# Patient Record
Sex: Female | Born: 1960 | Race: White | Hispanic: No | Marital: Single | Smoking: Current every day smoker
Health system: Southern US, Community
[De-identification: ages and names within clinical notes are randomized; demographics above are authoritative.]

## PROBLEM LIST (undated history)

## (undated) DIAGNOSIS — K589 Irritable bowel syndrome without diarrhea: Secondary | ICD-10-CM

## (undated) DIAGNOSIS — F419 Anxiety disorder, unspecified: Secondary | ICD-10-CM

## (undated) DIAGNOSIS — Z7189 Other specified counseling: Secondary | ICD-10-CM

## (undated) DIAGNOSIS — B009 Herpesviral infection, unspecified: Secondary | ICD-10-CM

## (undated) DIAGNOSIS — N951 Menopausal and female climacteric states: Principal | ICD-10-CM

## (undated) DIAGNOSIS — M8700 Idiopathic aseptic necrosis of unspecified bone: Secondary | ICD-10-CM

## (undated) DIAGNOSIS — G709 Myoneural disorder, unspecified: Secondary | ICD-10-CM

## (undated) DIAGNOSIS — M797 Fibromyalgia: Secondary | ICD-10-CM

## (undated) DIAGNOSIS — F17200 Nicotine dependence, unspecified, uncomplicated: Secondary | ICD-10-CM

## (undated) HISTORY — DX: Fibromyalgia: M79.7

## (undated) HISTORY — PX: CARPAL TUNNEL RELEASE: SHX101

## (undated) HISTORY — DX: Irritable bowel syndrome without diarrhea: K58.9

## (undated) HISTORY — PX: KNEE SURGERY: SHX244

## (undated) HISTORY — DX: Nicotine dependence, unspecified, uncomplicated: F17.200

## (undated) HISTORY — PX: NECK SURGERY: SHX720

## (undated) HISTORY — DX: Herpesviral infection, unspecified: B00.9

## (undated) HISTORY — PX: ABDOMINAL HYSTERECTOMY: SHX81

## (undated) HISTORY — DX: Menopausal and female climacteric states: N95.1

## (undated) HISTORY — PX: TUBAL LIGATION: SHX77

## (undated) HISTORY — DX: Myoneural disorder, unspecified: G70.9

## (undated) HISTORY — DX: Anxiety disorder, unspecified: F41.9

## (undated) HISTORY — DX: Other specified counseling: Z71.89

## (undated) HISTORY — DX: Idiopathic aseptic necrosis of unspecified bone: M87.00

## (undated) HISTORY — PX: SPINAL FUSION: SHX223

---

## 2004-07-11 ENCOUNTER — Emergency Department (HOSPITAL_COMMUNITY): Admission: EM | Admit: 2004-07-11 | Discharge: 2004-07-11 | Payer: Self-pay | Admitting: Emergency Medicine

## 2005-01-19 ENCOUNTER — Ambulatory Visit (HOSPITAL_COMMUNITY): Admission: RE | Admit: 2005-01-19 | Discharge: 2005-01-19 | Payer: Self-pay | Admitting: Obstetrics and Gynecology

## 2006-02-07 ENCOUNTER — Ambulatory Visit (HOSPITAL_COMMUNITY): Admission: RE | Admit: 2006-02-07 | Discharge: 2006-02-07 | Payer: Self-pay | Admitting: Internal Medicine

## 2007-02-20 ENCOUNTER — Ambulatory Visit (HOSPITAL_COMMUNITY): Admission: RE | Admit: 2007-02-20 | Discharge: 2007-02-20 | Payer: Self-pay | Admitting: Internal Medicine

## 2009-11-03 ENCOUNTER — Emergency Department (HOSPITAL_COMMUNITY): Admission: EM | Admit: 2009-11-03 | Discharge: 2009-11-03 | Payer: Self-pay | Admitting: Emergency Medicine

## 2010-05-14 LAB — URINALYSIS, ROUTINE W REFLEX MICROSCOPIC
Bilirubin Urine: NEGATIVE
Glucose, UA: NEGATIVE mg/dL
Hgb urine dipstick: NEGATIVE
Ketones, ur: NEGATIVE mg/dL
Nitrite: NEGATIVE
Protein, ur: NEGATIVE mg/dL
Specific Gravity, Urine: 1.01 (ref 1.005–1.030)
Urobilinogen, UA: 0.2 mg/dL (ref 0.0–1.0)
pH: 6.5 (ref 5.0–8.0)

## 2010-05-14 LAB — CBC
HCT: 34.6 % — ABNORMAL LOW (ref 36.0–46.0)
Hemoglobin: 11.6 g/dL — ABNORMAL LOW (ref 12.0–15.0)
MCH: 32.1 pg (ref 26.0–34.0)
MCHC: 33.5 g/dL (ref 30.0–36.0)
MCV: 95.8 fL (ref 78.0–100.0)
Platelets: 275 10*3/uL (ref 150–400)
RBC: 3.61 MIL/uL — ABNORMAL LOW (ref 3.87–5.11)
RDW: 14.9 % (ref 11.5–15.5)
WBC: 6.9 10*3/uL (ref 4.0–10.5)

## 2010-05-14 LAB — DIFFERENTIAL
Basophils Absolute: 0.2 10*3/uL — ABNORMAL HIGH (ref 0.0–0.1)
Basophils Relative: 2 % — ABNORMAL HIGH (ref 0–1)
Eosinophils Absolute: 0.1 10*3/uL (ref 0.0–0.7)
Eosinophils Relative: 2 % (ref 0–5)
Lymphocytes Relative: 52 % — ABNORMAL HIGH (ref 12–46)
Lymphs Abs: 3.6 10*3/uL (ref 0.7–4.0)
Monocytes Absolute: 0.3 10*3/uL (ref 0.1–1.0)
Monocytes Relative: 4 % (ref 3–12)
Neutro Abs: 2.7 10*3/uL (ref 1.7–7.7)
Neutrophils Relative %: 40 % — ABNORMAL LOW (ref 43–77)

## 2010-05-14 LAB — COMPREHENSIVE METABOLIC PANEL
ALT: 14 U/L (ref 0–35)
AST: 13 U/L (ref 0–37)
Albumin: 4.2 g/dL (ref 3.5–5.2)
Alkaline Phosphatase: 108 U/L (ref 39–117)
BUN: 8 mg/dL (ref 6–23)
CO2: 29 mEq/L (ref 19–32)
Calcium: 9.4 mg/dL (ref 8.4–10.5)
Chloride: 105 mEq/L (ref 96–112)
Creatinine, Ser: 0.65 mg/dL (ref 0.4–1.2)
GFR calc Af Amer: >60 mL/min (ref >60)
GFR calc non Af Amer: >60 mL/min (ref >60)
Glucose, Bld: 98 mg/dL (ref 70–99)
Potassium: 4.1 mEq/L (ref 3.5–5.1)
Sodium: 140 mEq/L (ref 135–145)
Total Bilirubin: 0.5 mg/dL (ref 0.3–1.2)
Total Protein: 7.7 g/dL (ref 6.0–8.3)

## 2010-07-03 ENCOUNTER — Emergency Department (HOSPITAL_COMMUNITY)
Admission: EM | Admit: 2010-07-03 | Discharge: 2010-07-03 | Disposition: A | Discharge status: Home / Self Care | Payer: Medicare Other | Attending: Emergency Medicine | Admitting: Emergency Medicine

## 2010-07-03 DIAGNOSIS — H9209 Otalgia, unspecified ear: Secondary | ICD-10-CM | POA: Insufficient documentation

## 2010-07-03 DIAGNOSIS — IMO0002 Reserved for concepts with insufficient information to code with codable children: Secondary | ICD-10-CM | POA: Insufficient documentation

## 2010-09-02 ENCOUNTER — Emergency Department (HOSPITAL_COMMUNITY): Payer: Medicare Other

## 2010-09-02 ENCOUNTER — Emergency Department (HOSPITAL_COMMUNITY)
Admission: EM | Admit: 2010-09-02 | Discharge: 2010-09-02 | Disposition: A | Discharge status: Home / Self Care | Payer: Medicare Other | Attending: Emergency Medicine | Admitting: Emergency Medicine

## 2010-09-02 DIAGNOSIS — G8929 Other chronic pain: Secondary | ICD-10-CM | POA: Insufficient documentation

## 2010-09-02 DIAGNOSIS — S300XXA Contusion of lower back and pelvis, initial encounter: Secondary | ICD-10-CM | POA: Insufficient documentation

## 2010-09-02 DIAGNOSIS — M542 Cervicalgia: Secondary | ICD-10-CM | POA: Insufficient documentation

## 2010-09-02 DIAGNOSIS — K219 Gastro-esophageal reflux disease without esophagitis: Secondary | ICD-10-CM | POA: Insufficient documentation

## 2010-09-02 DIAGNOSIS — S93409A Sprain of unspecified ligament of unspecified ankle, initial encounter: Secondary | ICD-10-CM | POA: Insufficient documentation

## 2010-09-02 DIAGNOSIS — W108XXA Fall (on) (from) other stairs and steps, initial encounter: Secondary | ICD-10-CM | POA: Insufficient documentation

## 2010-09-02 DIAGNOSIS — Z79899 Other long term (current) drug therapy: Secondary | ICD-10-CM | POA: Insufficient documentation

## 2010-12-21 ENCOUNTER — Emergency Department (HOSPITAL_COMMUNITY)
Admission: EM | Admit: 2010-12-21 | Discharge: 2010-12-21 | Disposition: A | Discharge status: Home / Self Care | Payer: Medicare Other | Attending: Emergency Medicine | Admitting: Emergency Medicine

## 2010-12-21 ENCOUNTER — Encounter: Payer: Self-pay | Admitting: *Deleted

## 2010-12-21 DIAGNOSIS — F17200 Nicotine dependence, unspecified, uncomplicated: Secondary | ICD-10-CM | POA: Insufficient documentation

## 2010-12-21 DIAGNOSIS — N390 Urinary tract infection, site not specified: Secondary | ICD-10-CM

## 2010-12-21 LAB — URINE MICROSCOPIC-ADD ON

## 2010-12-21 LAB — URINALYSIS, ROUTINE W REFLEX MICROSCOPIC
Glucose, UA: 250 mg/dL — AB
Hgb urine dipstick: NEGATIVE
Nitrite: POSITIVE — AB
Protein, ur: 100 mg/dL — AB
Specific Gravity, Urine: 1.02 (ref 1.005–1.030)
Urobilinogen, UA: >8 mg/dL — ABNORMAL HIGH (ref 0.0–1.0)
pH: 5 (ref 5.0–8.0)

## 2010-12-21 LAB — GLUCOSE, CAPILLARY: Glucose-Capillary: 89 mg/dL (ref 70–99)

## 2010-12-21 MED ORDER — CEPHALEXIN 500 MG PO CAPS: 500.0000 mg | ORAL_CAPSULE | Freq: Four times a day (QID) | ORAL | Status: AC

## 2010-12-21 NOTE — ED Notes (Signed)
Pt states pain with urination, pressure to lower abdomen with urination and right lower back pain. NAD

## 2010-12-21 NOTE — ED Provider Notes (Signed)
History     CSN: [id number] Arrival date & time: 12/21/2010  5:06 PM   First MD Initiated Contact with Patient 12/21/10 1722    Chief Complaint  Patient presents with  . Dysuria    (Consider location/radiation/quality/duration/timing/severity/associated sxs/prior treatment) Patient is a 50 y.o. female presenting with dysuria. The history is provided by the patient.  Dysuria  This is a new problem. The current episode started 12 to 24 hours ago. The problem occurs every urination. The problem has been gradually worsening. The quality of the pain is described as burning. The pain is mild. There has been no fever. Associated symptoms include chills, urgency and flank pain. Pertinent negatives include no vomiting and no discharge. Treatments tried: AZO.    History reviewed. No pertinent past medical history.  Past Surgical History  Procedure Date  . Spinal fusion   . Neck surgery   . Carpal tunnel release   . Cesarean section   . Abdominal hysterectomy   . Knee surgery     No family history on file.  History  Substance Use Topics  . Smoking status: Current Everyday Smoker  . Smokeless tobacco: Not on file  . Alcohol Use: No    OB History    Grav Para Term Preterm Abortions TAB SAB Ect Mult Living                  Review of Systems  Constitutional: Positive for chills.  Gastrointestinal: Negative for vomiting.  Genitourinary: Positive for dysuria, urgency and flank pain.  Musculoskeletal: Positive for gait problem.    Allergies  Prednisone and Zomig  Home Medications   Current Outpatient Rx  Name Route Sig Dispense Refill  . ALPRAZOLAM 1 MG PO TABS Oral Take 1 mg by mouth 3 (three) times daily.      Marland Kitchen GABAPENTIN 800 MG PO TABS Oral Take 800 mg by mouth 4 (four) times daily.      Marland Kitchen KETOPROFEN 75 MG PO CAPS Oral Take 75 mg by mouth 4 (four) times daily as needed. migrains     . OMEPRAZOLE 40 MG PO CPDR Oral Take 40 mg by mouth daily.      Marland Kitchen PROMETHAZINE HCL  25 MG PO TABS Oral Take 25 mg by mouth every 6 (six) hours as needed. Nausea/vomiting     . TRAZODONE HCL 100 MG PO TABS Oral Take 100 mg by mouth at bedtime.      . CEPHALEXIN 500 MG PO CAPS Oral Take 1 capsule (500 mg total) by mouth 4 (four) times daily. 20 capsule 0    BP 112/69  Pulse 72  Temp(Src) 98.3 F (36.8 C) (Oral)  Resp 18  Ht 5\' 4"  (1.626 m)  Wt 118 lb (53.524 kg)  BMI 20.25 kg/m2  SpO2 98%  Physical Exam  CONSTITUTIONAL: Well developed/well nourished HEAD AND FACE: Normocephalic/atraumatic EYES: EOMI/PERRL ENMT: Mucous membranes moist NECK: supple no meningeal signs SPINE:entire spine nontender CV: S1/S2 noted, no murmurs/rubs/gallops noted LUNGS: Lungs are clear to auscultation bilaterally, no apparent distress ABDOMEN: soft, nontender, no rebound or guarding GU:no cva tenderness NEURO: Pt is awake/alert, moves all extremitiesx4, she is well appearing, using cell phone EXTREMITIES: pulses normal, full ROM SKIN: warm, color normal PSYCH: no abnormalities of mood noted   ED Course  Procedures (including critical care time)  Labs Reviewed  URINALYSIS, ROUTINE W REFLEX MICROSCOPIC - Abnormal; Notable for the following:    Color, Urine ORANGE (*) BIOCHEMICALS MAY BE AFFECTED BY COLOR  Glucose, UA 250 (*)    Bilirubin Urine SMALL (*)    Ketones, ur TRACE (*)    Protein, ur 100 (*)    Urobilinogen, UA >8.0 (*)    Nitrite POSITIVE (*)    Leukocytes, UA SMALL (*)    All other components within normal limits  URINE MICROSCOPIC-ADD ON - Abnormal; Notable for the following:    Squamous Epithelial / LPF FEW (*)    All other components within normal limits  GLUCOSE, CAPILLARY  POCT CBG MONITORING    1. UTI (lower urinary tract infection)       MDM  Nursing notes reviewed and considered in documentation All labs/vitals reviewed and considered         Joya Gaskins, MD 12/21/10 1744

## 2010-12-21 NOTE — Discharge Instructions (Signed)

## 2011-07-13 ENCOUNTER — Other Ambulatory Visit (HOSPITAL_COMMUNITY): Payer: Self-pay | Admitting: Internal Medicine

## 2011-07-13 DIAGNOSIS — Z139 Encounter for screening, unspecified: Secondary | ICD-10-CM

## 2011-07-13 DIAGNOSIS — Z1231 Encounter for screening mammogram for malignant neoplasm of breast: Secondary | ICD-10-CM

## 2011-07-15 ENCOUNTER — Ambulatory Visit (HOSPITAL_COMMUNITY): Payer: Medicare Other

## 2011-08-21 ENCOUNTER — Emergency Department (HOSPITAL_COMMUNITY): Payer: Medicare Other

## 2011-08-21 ENCOUNTER — Emergency Department (HOSPITAL_COMMUNITY)
Admission: EM | Admit: 2011-08-21 | Discharge: 2011-08-21 | Disposition: A | Discharge status: Home / Self Care | Payer: Medicare Other | Attending: Emergency Medicine | Admitting: Emergency Medicine

## 2011-08-21 ENCOUNTER — Encounter (HOSPITAL_COMMUNITY): Payer: Self-pay | Admitting: Physical Medicine and Rehabilitation

## 2011-08-21 DIAGNOSIS — S92001A Unspecified fracture of right calcaneus, initial encounter for closed fracture: Secondary | ICD-10-CM

## 2011-08-21 DIAGNOSIS — Y9389 Activity, other specified: Secondary | ICD-10-CM | POA: Insufficient documentation

## 2011-08-21 DIAGNOSIS — IMO0002 Reserved for concepts with insufficient information to code with codable children: Secondary | ICD-10-CM | POA: Insufficient documentation

## 2011-08-21 DIAGNOSIS — Z981 Arthrodesis status: Secondary | ICD-10-CM | POA: Insufficient documentation

## 2011-08-21 DIAGNOSIS — F17200 Nicotine dependence, unspecified, uncomplicated: Secondary | ICD-10-CM | POA: Insufficient documentation

## 2011-08-21 DIAGNOSIS — S92009A Unspecified fracture of unspecified calcaneus, initial encounter for closed fracture: Secondary | ICD-10-CM | POA: Insufficient documentation

## 2011-08-21 DIAGNOSIS — Y998 Other external cause status: Secondary | ICD-10-CM | POA: Insufficient documentation

## 2011-08-21 MED ORDER — HYDROCODONE-ACETAMINOPHEN 5-325 MG PO TABS
2.0000 | ORAL_TABLET | Freq: Once | ORAL | Status: AC
  Administered 2011-08-21: 2 via ORAL
  Filled 2011-08-21: qty 2

## 2011-08-21 MED ORDER — HYDROCODONE-ACETAMINOPHEN 5-325 MG PO TABS: ORAL_TABLET | ORAL | Status: DC

## 2011-08-21 MED ORDER — IBUPROFEN 400 MG PO TABS
800.0000 mg | ORAL_TABLET | Freq: Once | ORAL | Status: AC
  Administered 2011-08-21: 800 mg via ORAL
  Filled 2011-08-21: qty 2

## 2011-08-21 MED ORDER — IBUPROFEN 800 MG PO TABS: 800.0000 mg | ORAL_TABLET | Freq: Three times a day (TID) | ORAL | Status: AC | PRN

## 2011-08-21 NOTE — Progress Notes (Signed)
Orthopedic Tech Progress Note Patient Details:  Jacqueline Phillips 1960-09-26 [id number]  Ortho Devices Type of Ortho Device: Crutches;CAM walker Ortho Device/Splint Location: right foot Ortho Device/Splint Interventions: Application   [name] 08/21/2011, 8:26 PM

## 2011-08-21 NOTE — ED Provider Notes (Signed)
History   This chart was scribed for Cyndra Numbers, MD scribed by Magnus Sinning. The patient was seen in room TR09C/TR09C seen at 19:44.    CSN: [id number]  Arrival date & time 08/21/11  1703   First MD Initiated Contact with Patient 08/21/11 1903      Chief Complaint  Patient presents with  . Foot Injury    (Consider location/radiation/quality/duration/timing/severity/associated sxs/prior treatment) HPI Freja Faro Alona Bene is a 51 y.o. female who presents to the Emergency Department complaining of constant moderate right foot pain with associated swelling as a result of a foot injury occuring 4 days ago. Pt states she stood up and stepped on her foot after sitting down and watching TV at home. Patient believes her right leg might have been asleep when she stood up, causing the injury.  Patient does endorse that she jumped up very suddenly and her husband who is present does report that she struck the floor with impressive force. Patient states she has not taken any medication and says she's used ice treatments with mild relief. She currently rates pain a 4/10 currently and states she 12 years ago saw an orthopedist, Dr. Anthoney Harada in [place] for steroid injection for back pain, but denies hx of foot injury or foot pain. Patient is unsure if whether she has hx of osteoporosis.     No past medical history on file.  Past Surgical History  Procedure Date  . Spinal fusion   . Neck surgery   . Carpal tunnel release   . Cesarean section   . Abdominal hysterectomy   . Knee surgery     No family history on file.  History  Substance Use Topics  . Smoking status: Current Everyday Smoker  . Smokeless tobacco: Not on file  . Alcohol Use: No    Review of Systems 10 Systems reviewed and are negative for acute change except as noted in the HPI. Allergies  Prednisone and Zomig  Home Medications   Current Outpatient Rx  Name Route Sig Dispense Refill  . ALPRAZOLAM 1 MG PO TABS Oral Take 1 mg by  mouth daily.     Marland Kitchen GABAPENTIN 800 MG PO TABS Oral Take 800 mg by mouth 4 (four) times daily.      Marland Kitchen KETOPROFEN 75 MG PO CAPS Oral Take 75 mg by mouth 4 (four) times daily as needed. migrains     . PROMETHAZINE HCL 25 MG PO TABS Oral Take 25 mg by mouth every 6 (six) hours as needed. Nausea/vomiting     . TRAZODONE HCL 100 MG PO TABS Oral Take 100 mg by mouth at bedtime.        BP 120/65  Pulse 63  Temp 97.7 F (36.5 C) (Oral)  Resp 18  SpO2 99%  Physical Exam  Nursing note and vitals reviewed.  GEN: Well-developed, well-nourished female in no distress HEENT: Atraumatic, normocephalic. Oropharynx clear without erythema EYES: PERRLA BL, no scleral icterus. NECK: Trachea midline, no meningismus CV: regular rate and rhythm.  PULM: No respiratory distress.   Neuro: cranial nerves grossly 2-12 intact, no abnormalities of strength or sensation, A and O x 3 MSK: No deformity.Tenderness to palpation and ecchymosis on right lateral side of heel with swelling of the foot.  Skin: No rashes petechiae, purpura, or jaundice Psych: no abnormality of mood  ED Course  Procedures (including critical care time) DIAGNOSTIC STUDIES: Oxygen Saturation is 99% on room air, normal by my interpretation.    COORDINATION OF CARE:  Dg Foot Complete Right  08/21/2011  *RADIOLOGY REPORT*  Clinical Data: Right foot injury and pain.  RIGHT FOOT COMPLETE - 3+ VIEW  Comparison: None  Findings: A small avulsion fracture off the lateral calcaneus is noted. No other fracture, subluxation or dislocation noted. The Lisfranc joints are intact. No focal bony lesions are noted.  IMPRESSION: Small lateral calcaneal avulsion fracture.  Original Report Authenticated By: Rosendo Gros, M.D.     1. Fracture of right calcaneus       MDM  Patient was evaluated by myself. Given her presentation I was concerned over possible bony injury versus soft tissue injury to the right foot. Plain film was performed and did show a  small calcaneal avulsion fracture. Patient was advised to use ice, ibuprofen, and Vicodin as prescribed for her symptoms. She is to use elevation when possible to decrease swelling. As patient was having difficulty walking she was given crutches and was placed in a Cam Walker to prevent herself from striking the injury. Patient was given followup information for Dr. Magnus Ivan as she wished to assume care with an orthopedist in the area for this as well as her previous back issues. She understood that management for this would likely be non-operative. Patient did not have any symptoms to suggest ankle fracture or ankle instability. She was discharged in good condition with prescription for Vicodin and ibuprofen.  I personally performed the services described in this documentation, which was scribed in my presence. The recorded information has been reviewed and considered.          Cyndra Numbers, MD 08/22/11 1209

## 2011-08-21 NOTE — ED Notes (Signed)
On tuesday sitting on the chair watching TV, when i she tries to get up foot went to sleep and as she tries" to stand up to walk and the food did not want to go" and she fall and twisted her right foot, not edema to right ankle and some redness. Right foot is cold and tender to touch

## 2011-08-21 NOTE — ED Notes (Addendum)
Pt presents to department for evaluation of R foot injury. States she bent her foot "the wrong way" on Tuesday. Bruising noted and redness noted. Able to wiggle digits. Sensation intact. Pt conscious alert and oriented x4. 5/10 pain at the time.

## 2011-08-21 NOTE — Discharge Instructions (Signed)
Please do not bear weight on your right foot until you followup with orthopedics. Calcaneal Fracture A fracture is a break in the bone. The calcaneus is the large irregular bone in the foot that makes up the heel of the foot. This bone can be fractured in many ways. There are many different ways of treating these fractures. There is not universal agreement on the treatment of these fractures and there is often more than one way of treating these fractures, all of which can be correct. TREATMENTS Calcaneal fractures can be treated with:  Immobilization, which means the fracture is casted as it is without changing the positions of the fracture (bone pieces) involved.   Closed reduction, in which the bones are manipulated back into position without opening the site of the fracture (break) using surgery.   ORIF (open reduction and internal fixation), in which the fracture site is opened and the bone pieces are fixed into place with some type of hardware (for example, screws, pins or plates).   Primary arthrodesis (fusion), which means that the joint has enough damage that a procedure is done as the first treatment which will leave the joint permanently stiff. This will decrease function, however usually will leave the joint pain free.  Your caregiver will discuss the type of fracture you have and the treatment that will be best for that problem. If surgery is the treatment of choice, the following is information for you to know and also let your caregiver know about prior to surgery.  LET YOUR CAREGIVERS KNOW ABOUT:  Allergies.   Medications taken including herbs, eye drops, over the counter medications, and creams.   Use of steroids (by mouth or creams).   Previous problems with anesthetics or novocaine.   A family history of anesthetic complication.   Possibility of pregnancy, if this applies.   History of blood clots (thrombophlebitis).   History of bleeding or blood problems.    Previous surgery.   Other health problems.  AFTER THE PROCEDURE After surgery, you will be taken to the recovery area where a nurse will watch and check your progress. Once you are awake, stable, and taking fluids well, barring other problems you will be allowed to go home. Once home an ice pack applied to your operative site may help with discomfort and keep the swelling down. Elevate your foot above your heart for the first 7-10 days after surgery. Do this as much as possible. HOME CARE INSTRUCTIONS   Follow your caregiver's instructions as to activities, exercises, physical therapy, and driving a car. Do not drive a car until your caregiver specifically tells you it is safe to do so.   Use crutches as directed by your caregiver.   Daily exercise is helpful to prevent return of problems. Maintain strength and range of motion as instructed.   Only take over-the-counter or prescription medicines for pain, discomfort, or fever as directed by your caregiver.   Be certain to make all of your follow-up appointments. This is critical for optimal healing.  SEEK MEDICAL CARE IF:   Increased bleeding (more than a small spot) from the wound or from beneath your cast or splint.   Redness, swelling, or increasing pain in the wound or from beneath your cast or splint.   Pus coming from wound or from beneath your cast or splint.   An unexplained oral temperature above 102 F (38.9 C) develops.   A foul smell coming from the wound or dressing or from beneath your  cast, splint or removable fracture boot.  SEEK IMMEDIATE MEDICAL CARE IF:   You develop a rash, have difficulty breathing, or have any problems which seem to be from an allergy.   You develop swelling or inability to move your foot or toes.   You develop tingling or numbness in your foot or toes.   Your foot or toes turn blue, pale or cold.   You develop severe pain in the area of your injury.  If you do not have a window in  your cast for observing the wound, a discharge or minor bleeding may show up as a stain on the outside of your cast. Report these findings to your caregiver. If you have been given a removable fracture boot, a small amount of bleeding through the dressings is normal. Change the dressings as instructed by your caregiver. MAKE SURE YOU:   Understand these instructions.   Will watch your condition.   Will get help right away if you are not doing well or get worse.  Document Released: 11/25/2004 Document Revised: 02/04/2011 Document Reviewed: 09/20/2007 [facility] Patient Information 2012 [place], [place].

## 2011-12-17 ENCOUNTER — Ambulatory Visit (HOSPITAL_COMMUNITY)
Admission: RE | Admit: 2011-12-17 | Discharge: 2011-12-17 | Disposition: A | Discharge status: Home / Self Care | Payer: Medicare Other | Source: Ambulatory Visit | Attending: Internal Medicine | Admitting: Internal Medicine

## 2011-12-17 DIAGNOSIS — Z1231 Encounter for screening mammogram for malignant neoplasm of breast: Secondary | ICD-10-CM

## 2012-11-08 ENCOUNTER — Other Ambulatory Visit (HOSPITAL_COMMUNITY): Payer: Self-pay | Admitting: Internal Medicine

## 2012-11-08 DIAGNOSIS — Z1231 Encounter for screening mammogram for malignant neoplasm of breast: Secondary | ICD-10-CM

## 2012-12-18 ENCOUNTER — Ambulatory Visit (HOSPITAL_COMMUNITY): Admission: RE | Admit: 2012-12-18 | Payer: Medicare Other | Source: Ambulatory Visit

## 2013-01-05 ENCOUNTER — Other Ambulatory Visit (HOSPITAL_COMMUNITY): Payer: Self-pay | Admitting: Internal Medicine

## 2013-01-16 ENCOUNTER — Encounter: Payer: Self-pay | Admitting: Advanced Practice Midwife

## 2013-01-16 ENCOUNTER — Ambulatory Visit (INDEPENDENT_AMBULATORY_CARE_PROVIDER_SITE_OTHER): Payer: Medicare Other | Admitting: Advanced Practice Midwife

## 2013-01-16 VITALS — BP 120/80 | Ht 64.5 in | Wt 117.0 lb

## 2013-01-16 DIAGNOSIS — N898 Other specified noninflammatory disorders of vagina: Secondary | ICD-10-CM

## 2013-01-16 DIAGNOSIS — R6882 Decreased libido: Secondary | ICD-10-CM | POA: Insufficient documentation

## 2013-01-16 DIAGNOSIS — B009 Herpesviral infection, unspecified: Secondary | ICD-10-CM | POA: Insufficient documentation

## 2013-01-16 DIAGNOSIS — Z01419 Encounter for gynecological examination (general) (routine) without abnormal findings: Secondary | ICD-10-CM

## 2013-01-16 NOTE — Patient Instructions (Signed)
Luvena:  Vaginal moisturizer- use 3x/week for several weeks; may decrease to 1-2x/week  Astroglide:  Use on both partners during intercourse

## 2013-01-16 NOTE — Progress Notes (Signed)
Jacqueline Phillips 52 y.o. Here for annual GYN physical. Patient has complaints of vaginal discharge with odor and irritation.Treated for yeast infection a couple weeks ago by PCP. Given Diflucan which helped resolve her symptoms for a couple of days. However discharge returned with odor. Also complains of decreased libido for the last several months, painful intercourse with vaginal dryness. Colonoscopy last year with several polyps removed. Mammogram last year, normal.   Past Medical History  Diagnosis Date  . Neuromuscular disorder     neuropathy  . Fibromyalgia   . HSV (herpes simplex virus) infection   . Anxiety    Past Surgical History  Procedure Laterality Date  . Spinal fusion    . Neck surgery    . Carpal tunnel release    . Cesarean section    . Abdominal hysterectomy    . Knee surgery     History   Social History  . Marital Status: Single    Spouse Name: N/A    Number of Children: N/A  . Years of Education: N/A   Occupational History  . Not on file.   Social History Main Topics  . Smoking status: Current Every Day Smoker -- 0.75 packs/day    Types: Cigarettes  . Smokeless tobacco: Never Used  . Alcohol Use: No  . Drug Use: No  . Sexual Activity: Yes   Other Topics Concern  . Not on file   Social History Narrative  . No narrative on file   Current outpatient prescriptions:ALPRAZolam (XANAX) 1 MG tablet, Take 1 mg by mouth daily. , Disp: , Rfl: ;  gabapentin (NEURONTIN) 800 MG tablet, Take 800 mg by mouth 4 (four) times daily.  , Disp: , Rfl: ;  imipramine (TOFRANIL) 25 MG tablet, Take 25 mg by mouth daily. , Disp: , Rfl: ;  ketoprofen (ORUDIS) 75 MG capsule, Take 75 mg by mouth as needed. migrains, Disp: , Rfl: ;  LIDODERM 5 %, Place 1 patch onto the skin as needed. , Disp: , Rfl:  oxyCODONE-acetaminophen (PERCOCET) 10-325 MG per tablet, Take 1 tablet by mouth every 6 (six) hours as needed. , Disp: , Rfl: ;  promethazine (PHENERGAN) 25 MG tablet, Take 25 mg by  mouth every 6 (six) hours as needed. Nausea/vomiting , Disp: , Rfl: ;  RESTASIS 0.05 % ophthalmic emulsion, Place 1 drop into both eyes daily. , Disp: , Rfl: ;  traZODone (DESYREL) 100 MG tablet, Take 100 mg by mouth at bedtime.  , Disp: , Rfl:  valACYclovir (VALTREX) 1000 MG tablet, Take 1,000 mg by mouth daily. , Disp: , Rfl: ;  HYDROcodone-acetaminophen (NORCO) 5-325 MG per tablet, Take 1-2 tabs by mouth every 6 hours as necessary for pain., Disp: 30 tablet, Rfl: 0    Physical Exam: General:  Well developed, well nourished, no acute distress Skin:  Warm and dry Neck:  Midline trachea, normal thyroid Lungs; Clear to auscultation bilaterally Breast:  No dominant palpable mass, retraction, or nipple discharge Cardiovascular: Regular rate and rhythm Abdomen:  Soft, non tender, no hepatosplenomegaly Pelvic:  External genitalia is normal in appearance.  The vagina is normal in appearance, but dry.  Scant vaginal discharge without odor.  Uterus and ovaries surgically absent. Rectal:deferred d/t colonscopy Extremities:  No swelling or varicosities noted Psych:  No mood changes   Wet prep. Negative for clue, yeast and bacteria.   Assessment Dyspareunia Vaginal dryness  Plan Patient scheduled [facility] at [facility] Discussed vaginal moisturizer vs. Lubricant vs. Osphena Patient would  like use a combination of Luvena and lubricant during sexual intercourse to help with dyspareunia. F/U as needed.

## 2013-01-24 ENCOUNTER — Other Ambulatory Visit: Payer: Self-pay | Admitting: Adult Health

## 2013-04-07 ENCOUNTER — Other Ambulatory Visit: Payer: Self-pay | Admitting: Adult Health

## 2013-04-16 ENCOUNTER — Telehealth: Payer: Self-pay | Admitting: Adult Health

## 2013-04-16 NOTE — Telephone Encounter (Signed)
Having hot flashes make appt to discuss, did talk options briefly

## 2013-04-16 NOTE — Telephone Encounter (Signed)
Pt states that she has been having night sweats the past few nights. Pt states that she is not on any kind of hormone replacement at all. Pt offered an appointment for this week but refused.

## 2013-04-27 ENCOUNTER — Ambulatory Visit: Payer: Medicare Other | Admitting: Adult Health

## 2013-05-02 ENCOUNTER — Encounter: Payer: Self-pay | Admitting: Adult Health

## 2013-05-02 ENCOUNTER — Ambulatory Visit (INDEPENDENT_AMBULATORY_CARE_PROVIDER_SITE_OTHER): Payer: Medicare Other | Admitting: Adult Health

## 2013-05-02 VITALS — BP 92/54 | Ht 64.0 in | Wt 121.0 lb

## 2013-05-02 DIAGNOSIS — N951 Menopausal and female climacteric states: Secondary | ICD-10-CM

## 2013-05-02 DIAGNOSIS — Z79890 Hormone replacement therapy (postmenopausal): Secondary | ICD-10-CM | POA: Insufficient documentation

## 2013-05-02 DIAGNOSIS — F17200 Nicotine dependence, unspecified, uncomplicated: Secondary | ICD-10-CM

## 2013-05-02 DIAGNOSIS — Z7189 Other specified counseling: Secondary | ICD-10-CM | POA: Insufficient documentation

## 2013-05-02 HISTORY — DX: Nicotine dependence, unspecified, uncomplicated: F17.200

## 2013-05-02 HISTORY — DX: Menopausal and female climacteric states: N95.1

## 2013-05-02 HISTORY — DX: Other specified counseling: Z71.89

## 2013-05-02 MED ORDER — ESTROGENS CONJUGATED 0.625 MG PO TABS: ORAL_TABLET | ORAL | Status: DC

## 2013-05-02 NOTE — Patient Instructions (Signed)
Smoking Cessation Quitting smoking is important to your health and has many advantages. However, it is not always easy to quit since nicotine is a very addictive drug. Often times, people try 3 times or more before being able to quit. This document explains the best ways for you to prepare to quit smoking. Quitting takes hard work and a lot of effort, but you can do it. ADVANTAGES OF QUITTING SMOKING You will live longer, feel better, and live better. Your body will feel the impact of quitting smoking almost immediately. Within 20 minutes, blood pressure decreases. Your pulse returns to its normal level. After 8 hours, carbon monoxide levels in the blood return to normal. Your oxygen level increases. After 24 hours, the chance of having a heart attack starts to decrease. Your breath, hair, and body stop smelling like smoke. After 48 hours, damaged nerve endings begin to recover. Your sense of taste and smell improve. After 72 hours, the body is virtually free of nicotine. Your bronchial tubes relax and breathing becomes easier. After 2 to 12 weeks, lungs can hold more air. Exercise becomes easier and circulation improves. The risk of having a heart attack, stroke, cancer, or lung disease is greatly reduced. After 1 year, the risk of coronary heart disease is cut in half. After 5 years, the risk of stroke falls to the same as a nonsmoker. After 10 years, the risk of lung cancer is cut in half and the risk of other cancers decreases significantly. After 15 years, the risk of coronary heart disease drops, usually to the level of a nonsmoker. If you are pregnant, quitting smoking will improve your chances of having a healthy baby. The people you live with, especially any children, will be healthier. You will have extra money to spend on things other than cigarettes. QUESTIONS TO THINK ABOUT BEFORE ATTEMPTING TO QUIT You may want to talk about your answers with your caregiver. Why do you want to  quit? If you tried to quit in the past, what helped and what did not? What will be the most difficult situations for you after you quit? How will you plan to handle them? Who can help you through the tough times? Your family? Friends? A caregiver? What pleasures do you get from smoking? What ways can you still get pleasure if you quit? Here are some questions to ask your caregiver: How can you help me to be successful at quitting? What medicine do you think would be best for me and how should I take it? What should I do if I need more help? What is smoking withdrawal like? How can I get information on withdrawal? GET READY Set a quit date. Change your environment by getting rid of all cigarettes, ashtrays, matches, and lighters in your home, car, or work. Do not let people smoke in your home. Review your past attempts to quit. Think about what worked and what did not. GET SUPPORT AND ENCOURAGEMENT You have a better chance of being successful if you have help. You can get support in many ways. Tell your family, friends, and co-workers that you are going to quit and need their support. Ask them not to smoke around you. Get individual, group, or telephone counseling and support. Programs are available at [facility] and health [facility]. Call your local health department for information about programs in your area. Spiritual beliefs and practices may help some smokers quit. Download a "quit meter" on your computer to keep track of quit statistics, such as  how long you have gone without smoking, cigarettes not smoked, and money saved. Get a self-help book about quitting smoking and staying off of tobacco. LEARN NEW SKILLS AND BEHAVIORS Distract yourself from urges to smoke. Talk to someone, go for a walk, or occupy your time with a task. Change your normal routine. Take a different route to work. Drink tea instead of coffee. Eat breakfast in a different place. Reduce your stress. Take a hot  bath, exercise, or read a book. Plan something enjoyable to do every day. Reward yourself for not smoking. Explore interactive web-based programs that specialize in helping you quit. GET MEDICINE AND USE IT CORRECTLY Medicines can help you stop smoking and decrease the urge to smoke. Combining medicine with the above behavioral methods and support can greatly increase your chances of successfully quitting smoking. Nicotine replacement therapy helps deliver nicotine to your body without the negative effects and risks of smoking. Nicotine replacement therapy includes nicotine gum, lozenges, inhalers, nasal sprays, and skin patches. Some may be available over-the-counter and others require a prescription. Antidepressant medicine helps people abstain from smoking, but how this works is unknown. This medicine is available by prescription. Nicotinic receptor partial agonist medicine simulates the effect of nicotine in your brain. This medicine is available by prescription. Ask your caregiver for advice about which medicines to use and how to use them based on your health history. Your caregiver will tell you what side effects to look out for if you choose to be on a medicine or therapy. Carefully read the information on the package. Do not use any other product containing nicotine while using a nicotine replacement product.  RELAPSE OR DIFFICULT SITUATIONS Most relapses occur within the first 3 months after quitting. Do not be discouraged if you start smoking again. Remember, most people try several times before finally quitting. You may have symptoms of withdrawal because your body is used to nicotine. You may crave cigarettes, be irritable, feel very hungry, cough often, get headaches, or have difficulty concentrating. The withdrawal symptoms are only temporary. They are strongest when you first quit, but they will go away within 10 14 days. To reduce the chances of relapse, try to: Avoid drinking alcohol.  Drinking lowers your chances of successfully quitting. Reduce the amount of caffeine you consume. Once you quit smoking, the amount of caffeine in your body increases and can give you symptoms, such as a rapid heartbeat, sweating, and anxiety. Avoid smokers because they can make you want to smoke. Do not let weight gain distract you. Many smokers will gain weight when they quit, usually less than 10 pounds. Eat a healthy diet and stay active. You can always lose the weight gained after you quit. Find ways to improve your mood other than smoking. FOR MORE INFORMATION  www.smokefree.gov  Document Released: 02/09/2001 Document Revised: 08/17/2011 Document Reviewed: 05/27/2011 [facility] Patient Information 2014 [place], [place]. Menopause Menopause is the normal time of life when menstrual periods stop completely. Menopause is complete when you have missed 12 consecutive menstrual periods. It usually occurs between the ages of 48 years and 55 years. Very rarely does a woman develop menopause before the age of 40 years. At menopause, your ovaries stop producing the female hormones estrogen and progesterone. This can cause undesirable symptoms and also affect your health. Sometimes the symptoms may occur 4 5 years before the menopause begins. There is no relationship between menopause and:  Oral contraceptives.  Number of children you had.  Race.  The age  your menstrual periods started (menarche). Heavy smokers and very thin women may develop menopause earlier in life. CAUSES  The ovaries stop producing the female hormones estrogen and progesterone.  Other causes include:  Surgery to remove both ovaries.  The ovaries stop functioning for no known reason.  Tumors of the pituitary gland in the brain.  Medical disease that affects the ovaries and hormone production.  Radiation treatment to the abdomen or pelvis.  Chemotherapy that affects the ovaries. SYMPTOMS   Hot flashes.  Night  sweats.  Decrease in sex drive.  Vaginal dryness and thinning of the vagina causing painful intercourse.  Dryness of the skin and developing wrinkles.  Headaches.  Tiredness.  Irritability.  Memory problems.  Weight gain.  Bladder infections.  Hair growth of the face and chest.  Infertility. More serious symptoms include:  Loss of bone (osteoporosis) causing breaks (fractures).  Depression.  Hardening and narrowing of the arteries (atherosclerosis) causing heart attacks and strokes. DIAGNOSIS   When the menstrual periods have stopped for 12 straight months.  Physical exam.  Hormone studies of the blood. TREATMENT  There are many treatment choices and nearly as many questions about them. The decisions to treat or not to treat menopausal changes is an individual choice made with your health care provider. Your health care provider can discuss the treatments with you. Together, you can decide which treatment will work best for you. Your treatment choices may include:   Hormone therapy (estrogen and progesterone).  Non-hormonal medicines.  Treating the individual symptoms with medicine (for example antidepressants for depression).  Herbal medicines that may help specific symptoms.  Counseling by a psychiatrist or psychologist.  Group therapy.  Lifestyle changes including:  Eating healthy.  Regular exercise.  Limiting caffeine and alcohol.  Stress management and meditation.  No treatment. HOME CARE INSTRUCTIONS   Take the medicine your health care provider gives you as directed.  Get plenty of sleep and rest.  Exercise regularly.  Eat a diet that contains calcium (good for the bones) and soy products (acts like estrogen hormone).  Avoid alcoholic beverages.  Do not smoke.  If you have hot flashes, dress in layers.  Take supplements, calcium, and vitamin D to strengthen bones.  You can use over-the-counter lubricants or moisturizers for  vaginal dryness.  Group therapy is sometimes very helpful.  Acupuncture may be helpful in some cases. SEEK MEDICAL CARE IF:   You are not sure you are in menopause.  You are having menopausal symptoms and need advice and treatment.  You are still having menstrual periods after age 76 years.  You have pain with intercourse.  Menopause is complete (no menstrual period for 12 months) and you develop vaginal bleeding.  You need a referral to a specialist (gynecologist, psychiatrist, or psychologist) for treatment. SEEK IMMEDIATE MEDICAL CARE IF:   You have severe depression.  You have excessive vaginal bleeding.  You fell and think you have a broken bone.  You have pain when you urinate.  You develop leg or chest pain.  You have a fast pounding heart beat (palpitations).  You have severe headaches.  You develop vision problems.  You feel a lump in your breast.  You have abdominal pain or severe indigestion. Document Released: 05/08/2003 Document Revised: 10/18/2012 Document Reviewed: 09/14/2012 [facility] Patient Information 2014 [place], [place]. Take premarin 1 daily Try luvena Return in 4 weeks Decrease cigarettes

## 2013-05-02 NOTE — Progress Notes (Signed)
Subjective:     Patient ID: Jacqueline Phillips, female   DOB: December 18, 1960, 53 y.o.   MRN: [id number]  HPI [name] is a 53 year old white female, married sp hysterectomy in complaining of hot flashes,night sweats and not sleeping and vaginal dryness with decreased libido.Husband has lung cancer.She has no history of stroke,MI or DVT or breast cancer.Would like to stop smoking, has tried e cigs and chantix in past.  Review of Systems See HPI Reviewed past medical,surgical, social and family history. Reviewed medications and allergies.     Objective:   Physical Exam BP 92/54  Ht 5\' 4"  (1.626 m)  Wt 121 lb (54.885 kg)  BMI 20.76 kg/m2   Discussed options of ET or Brisdelle and she wants to try estrogen, she is aware of risks and benefits and wants oral estrogen.Says she can't keep on like this.  Assessment:     Hot flashes,menopausal Nicotine addiction   ET Plan:     Given samples of Premarin 0.625mg  #35 1 daily Try luvena   return in 4 weeks in follow up Review handout on menopause and smoking cessation Start cutting down on cigarettes and try patch or gum

## 2013-05-02 NOTE — Addendum Note (Signed)
Addended by: Cyril MourningGRIFFIN, Talia Hoheisel A on: 05/02/2013 11:56 AM   Modules accepted: Orders

## 2013-06-04 ENCOUNTER — Encounter: Payer: Self-pay | Admitting: *Deleted

## 2013-06-04 ENCOUNTER — Ambulatory Visit: Payer: Medicare Other | Admitting: Adult Health

## 2013-12-31 ENCOUNTER — Encounter: Payer: Self-pay | Admitting: Adult Health

## 2014-04-16 ENCOUNTER — Telehealth: Payer: Self-pay | Admitting: Adult Health

## 2014-04-16 NOTE — Telephone Encounter (Signed)
[facility] at [facility] [phone]

## 2014-04-16 NOTE — Telephone Encounter (Signed)
Spoke with pt. [place] [facility] [place]. She needs a refill on Valtrex 1000 mg 1 tab daily. Pharmacy phone # is [phone]. [facility]. Thanks!! JSY

## 2014-05-16 ENCOUNTER — Other Ambulatory Visit: Payer: Self-pay | Admitting: Adult Health

## 2014-05-22 ENCOUNTER — Ambulatory Visit (INDEPENDENT_AMBULATORY_CARE_PROVIDER_SITE_OTHER): Payer: Medicare Other | Admitting: Adult Health

## 2014-05-22 ENCOUNTER — Encounter: Payer: Self-pay | Admitting: Adult Health

## 2014-05-22 VITALS — BP 110/50 | HR 60 | Ht 64.0 in | Wt 116.5 lb

## 2014-05-22 DIAGNOSIS — Z01419 Encounter for gynecological examination (general) (routine) without abnormal findings: Secondary | ICD-10-CM | POA: Diagnosis not present

## 2014-05-22 DIAGNOSIS — Z1212 Encounter for screening for malignant neoplasm of rectum: Secondary | ICD-10-CM | POA: Diagnosis not present

## 2014-05-22 LAB — HEMOCCULT GUIAC POC 1CARD (OFFICE): Fecal Occult Blood, POC: NEGATIVE

## 2014-05-22 NOTE — Patient Instructions (Signed)
Physical in 1 year Mammogram yearly Labs with PCP Colonoscopy per GI 

## 2014-05-22 NOTE — Progress Notes (Signed)
Patient ID: Jacqueline Phillips P Phillips, female   DOB: 12-31-1960, 54 y.o.   MRN: [id number] History of Present Illness: Jacqueline Phillips is a 54 year old white female in for well woman gyn exam.She is sp hysterectomy.   Current Medications, Allergies, Past Medical History, Past Surgical History, Family History and Social History were reviewed in [name] electronic medical record.     Review of Systems: Patient denies any headaches, hearing loss, fatigue, blurred vision, shortness of breath, chest pain, abdominal pain, problems with bowel movements, urination, or intercourse(not having sex). No joint pain or mood swings.Still has hot flashes, but has stopped ET and she is decreasing her cigarettes, she is down to 1/2 a pack. She [place] Lucret[place]   Physical Exam:BP 110/50 mmHg  Pulse 60  Ht 5\' 4"  (1.626 m)  Wt 116 lb 8 oz (52.844 kg)  BMI 19.99 kg/m2 General:  Well developed, well nourished, no acute distress Skin:  Warm and dry Neck:  Midline trachea, normal thyroid, good ROM, no lymphadenopathy Lungs; Clear to auscultation bilaterally Breast:  No dominant palpable mass, retraction, or nipple discharge Cardiovascular: Regular rate and rhythm Abdomen:  Soft, non tender, no hepatosplenomegaly Pelvic:  External genitalia is normal in appearance, no lesions.  The vagina has decreased color, moisture and rugae. Urethra has no lesions or masses. The cervix and uterus are absent. No adnexal masses or tenderness noted.Bladder is non tender, no masses felt. Rectal: Good sphincter tone, no polyps, or hemorrhoids felt.  Hemoccult negative. Extremities/musculoskeletal:  No swelling or varicosities noted, no clubbing or cyanosis Psych:  No mood changes, alert and cooperative,seems happy   Impression: Well woman gyn exam no pap    Plan: Physical in 1 year Mammogram yearly Colonoscopy per GI Labs with PCP

## 2014-09-17 ENCOUNTER — Ambulatory Visit: Payer: Medicare Other | Attending: Physician Assistant | Admitting: Physical Therapy

## 2014-09-17 DIAGNOSIS — M545 Low back pain: Secondary | ICD-10-CM

## 2014-09-17 DIAGNOSIS — R5381 Other malaise: Secondary | ICD-10-CM | POA: Diagnosis present

## 2014-09-17 DIAGNOSIS — M2560 Stiffness of unspecified joint, not elsewhere classified: Secondary | ICD-10-CM | POA: Diagnosis present

## 2014-09-17 DIAGNOSIS — M5386 Other specified dorsopathies, lumbar region: Secondary | ICD-10-CM

## 2014-09-17 NOTE — Patient Instructions (Signed)
Patient instructed in left SKTC.

## 2014-09-17 NOTE — Therapy (Signed)
[facility] [address] [place], [place], [address] Phone: [phone]   Fax:  [phone]  Physical Therapy Evaluation  Patient Details  Name: Jacqueline SalviaBrenda P Balcom MRN: [id number] Date of Birth: July 31, 1960 Referring Provider:  Prentiss BellsPane, Mark, PA-C  Encounter Date: 09/17/2014      PT End of Session - 09/17/14 1032    Visit Number 1   Number of Visits 18   Date for PT Re-Evaluation 11/12/14   PT Start Time 0900   PT Stop Time 0947   PT Time Calculation (min) 47 min   Activity Tolerance Patient tolerated treatment well   Behavior During Therapy [facility] for tasks assessed/performed      Past Medical History  Diagnosis Date  . Neuromuscular disorder     neuropathy  . Fibromyalgia   . HSV (herpes simplex virus) infection   . Anxiety   . Hot flashes, menopausal 05/02/2013  . Nicotine addiction 05/02/2013  . Counseling for estrogen replacement therapy 05/02/2013    Past Surgical History  Procedure Laterality Date  . Spinal fusion    . Neck surgery    . Carpal tunnel release    . Cesarean section    . Abdominal hysterectomy    . Knee surgery    . Tubal ligation      There were no vitals filed for this visit.  Visit Diagnosis:  Left low back pain, with sciatica presence unspecified - Plan: PT plan of care cert/re-cert  Decreased range of motion of lumbar spine - Plan: PT plan of care cert/re-cert  Debility - Plan: PT plan of care cert/re-cert      Subjective Assessment - 09/17/14 1009    Subjective Been in a lot of pai for approximately 3 months.   Limitations Sitting;Walking   How long can you sit comfortably? 15-20 minutes   Patient Stated Goals Decrease pain in my back and left leg.   Currently in Pain? Yes   Pain Score 8    Pain Location Back   Pain Orientation Left   Pain Descriptors / Indicators Aching;Burning;Constant   Pain Type --  Sub-acute.   Pain Onset More than a month ago   Pain Frequency Constant   Aggravating  Factors  sitting, standing, walking and ADL's.   Pain Relieving Factors 'Nothing."            [facility] PT Assessment - 09/17/14 0001    Assessment   Medical Diagnosis Back and leg pain.   Onset Date/Surgical Date --  3 months.   Precautions   Precautions None   Restrictions   Weight Bearing Restrictions No   Balance Screen   Has the patient fallen in the past 6 months Yes   How many times? 1   Has the patient had a decrease in activity level because of a fear of falling?  No   Is the patient reluctant to leave their home because of a fear of falling?  No   Home [employer] [facility] managernvironment   Living Environment Private residence   Prior Function   Level of Independence Independent   Cognition   Overall Cognitive Status Within Functional Limits for tasks assessed   Observation/Other Assessments   Focus on Therapeutic Outcomes (FOTO)  65% limitation.   Posture/Postural Control   Posture/Postural Control Postural limitations   Postural Limitations Rounded Shoulders;Forward head;Decreased lumbar lordosis;Left pelvic obliquity;Flexed trunk   Posture Comments Mild lumbar convexity on left most likely due to muscle spasms.  Left iliac crest higher than right.  ROM / Strength   AROM / PROM / Strength AROM;Strength   AROM   Overall AROM Comments Active lumbar extension= 17 degrees and flexion is full.   Strength   Overall Strength Comments Near normal strength grade for LE of major muscle groups via manual muscle testing.   Palpation   Palpation comment Left lumbar spine is very guarded and in spasm.   Special Tests    Special Tests Lumbar;Sacrolliac Tests;Leg LengthTest  Diminished LE DTR's.   Lumbar Tests --  Negative SLR tetsing.   Sacroiliac Tests  --  Negative left FABER test.   Leg length test  --  Lt leg longer due to left ant pelvic rot. = after SKTC on lt   Ambulation/Gait   Gait Comments Very antalgic gait pattern with patient walking in spinal flexion ans some SBing of spine.                    [facility] Adult PT Treatment/Exercise - 09/17/14 0001    Modalities   Modalities Electrical Stimulation;Moist Heat   Moist Heat Therapy   Number Minutes Moist Heat 15 Minutes   Moist Heat Location Lumbar Spine   Electrical Stimulation   Electrical Stimulation Location Left low back.   Electrical Stimulation Action 80-150 HZ x 15 minutes.   Electrical Stimulation Goals Pain                PT Education - 09/17/14 1031    Education provided Yes   Person(s) Educated Patient   Methods Explanation;Demonstration   Comprehension Verbalized understanding          PT Short Term Goals - 09/17/14 1104    PT SHORT TERM GOAL #1   Title Ind with initial HEP.   Time 2   Period Weeks   Status New           PT Long Term Goals - 09/17/14 1106    PT LONG TERM GOAL #1   Title Ind with an advanced HEP.   Time 6   Period Weeks   Status New   PT LONG TERM GOAL #2   Title Sit 30 minutes with pain not > 3-4/10.   Time 6   Period Weeks   Status New   PT LONG TERM GOAL #3   Title Stand 30 minutes with pain not > 3-4/10.   Time 6   Period Weeks   Status New   PT LONG TERM GOAL #4   Title Perform ADL's with pain not > 4/10.   Time 6   Period Weeks   Status New   PT LONG TERM GOAL #5   Title Sleep undisturbed 6 hours.   Time 6   Period Weeks   Status New               Plan - 09/17/14 1032    Clinical Impression Statement The patient has a long h/o low back pain including lumbar surgeries.  However, over thelast 3 months she has had a great deal of low back pain with radiation into her left LE which includes numbness. Her pain ranges from 5-8+/10 with pain increasng with increased sitting, standing and walking.  Patient's sleep is also disturbed due to pain.   Pt will benefit from skilled therapeutic intervention in order to improve on the following deficits Pain;Decreased activity tolerance   Rehab Potential Good   PT Frequency 3x / week    PT Duration 6 weeks   PT Treatment/Interventions  ADLs/Self Care Home Management;Cryotherapy;Electrical Stimulation;Moist Heat;Ultrasound;Therapeutic activities;Therapeutic exercise;Manual techniques   PT Next Visit Plan Right SDLY position with pillows between knees--Please perform U/S f/b STW/M to left low back musculature.  Progress to core exercises.  Re-check left leg length--if not equal patient will need to perform a left single knee to chest estretch.   Consulted and Agree with Plan of Care Patient          G-Codes - 09/18/2014 1111    Functional Assessment Tool Used FOTO.   Functional Limitation Mobility: Walking and moving around   Mobility: Walking and Moving Around Current Status [phone]) At least 60 percent but less than 80 percent impaired, limited or restricted   Mobility: Walking and Moving Around Goal Status [phone]) At least 40 percent but less than 60 percent impaired, limited or restricted       Problem List Patient Active Problem List   Diagnosis Date Noted  . Hot flashes, menopausal 05/02/2013  . Nicotine addiction 05/02/2013  . Counseling for estrogen replacement therapy 05/02/2013  . HSV (herpes simplex virus) infection 01/16/2013  . Libido, decreased 01/16/2013    Lian Tanori, [place] MPT 09-18-2014, 11:14 AM  [facility] [address] [place], [place], [address] Phone: [phone]   Fax:  [phone]

## 2014-09-20 ENCOUNTER — Encounter: Payer: Self-pay | Admitting: *Deleted

## 2014-09-20 ENCOUNTER — Ambulatory Visit: Payer: Medicare Other | Admitting: *Deleted

## 2014-09-20 DIAGNOSIS — M545 Low back pain: Secondary | ICD-10-CM | POA: Diagnosis not present

## 2014-09-20 DIAGNOSIS — R5381 Other malaise: Secondary | ICD-10-CM

## 2014-09-20 DIAGNOSIS — M5386 Other specified dorsopathies, lumbar region: Secondary | ICD-10-CM

## 2014-09-20 NOTE — Therapy (Signed)
[facility] [address] [place], [place], [address] Phone: [phone]   Fax:  [phone]  Physical Therapy Treatment  Patient Details  Name: Jacqueline Phillips MRN: [id number] Date of Birth: 1960-05-30 Referring Provider:  Janene Madeira, MD  Encounter Date: 09/20/2014      PT End of Session - 09/20/14 1108    Visit Number 2   Number of Visits 18   Date for PT Re-Evaluation 11/12/14   PT Start Time 0815   PT Stop Time 0905   PT Time Calculation (min) 50 min      Past Medical History  Diagnosis Date  . Neuromuscular disorder     neuropathy  . Fibromyalgia   . HSV (herpes simplex virus) infection   . Anxiety   . Hot flashes, menopausal 05/02/2013  . Nicotine addiction 05/02/2013  . Counseling for estrogen replacement therapy 05/02/2013    Past Surgical History  Procedure Laterality Date  . Spinal fusion    . Neck surgery    . Carpal tunnel release    . Cesarean section    . Abdominal hysterectomy    . Knee surgery    . Tubal ligation      There were no vitals filed for this visit.  Visit Diagnosis:  Left low back pain, with sciatica presence unspecified  Decreased range of motion of lumbar spine  Debility      Subjective Assessment - 09/20/14 0815    Subjective Been in a lot of pai for approximately 3 months.   Limitations Sitting;Walking   How long can you sit comfortably? 15-20 minutes   Patient Stated Goals Decrease pain in my back and left leg.   Currently in Pain? Yes   Pain Score 7    Pain Location Back   Pain Descriptors / Indicators Aching;Burning;Constant   Pain Onset More than a month ago   Pain Frequency Constant   Aggravating Factors  ADL's   Pain Relieving Factors nothing                         [facility] Adult PT Treatment/Exercise - 09/20/14 0001    Exercises   Exercises Lumbar   Lumbar Exercises: Supine   Other Supine Lumbar Exercises adductor squeeze x 10, hold 5 secs  andSKTCx10, , HS isometrics at 90 degrees for mm energy techniques x10 ho   Modalities   Modalities Ultrasound;Insurance account manager Location Left low back.SIJ PRemod x 15 mins 80-150 hz   Electrical Stimulation Goals Pain   Ultrasound   Ultrasound Location LB paras/QL   Ultrasound Parameters 1.5 w/cm2 x 10 mins in RT sidelying   Ultrasound Goals Pain   Manual Therapy   Manual Therapy Soft tissue mobilization   Manual therapy comments STW/ TPR to LT QL and LB paras with pt. RT sidelying                  PT Short Term Goals - 09/17/14 1104    PT SHORT TERM GOAL #1   Title Ind with initial HEP.   Time 2   Period Weeks   Status New           PT Long Term Goals - 09/17/14 1106    PT LONG TERM GOAL #1   Title Ind with an advanced HEP.   Time 6   Period Weeks   Status New   PT LONG TERM GOAL #  2   Title Sit 30 minutes with pain not > 3-4/10.   Time 6   Period Weeks   Status New   PT LONG TERM GOAL #3   Title Stand 30 minutes with pain not > 3-4/10.   Time 6   Period Weeks   Status New   PT LONG TERM GOAL #4   Title Perform ADL's with pain not > 4/10.   Time 6   Period Weeks   Status New   PT LONG TERM GOAL #5   Title Sleep undisturbed 6 hours.   Time 6   Period Weeks   Status New               Plan - 09/20/14 1110    Clinical Impression Statement Pt did very well with Rx today and was able to perform muscle energy techniques to align LT SIJ. Her ASIS's were level and  leg lengths were equal.  she had notable QL and LB para tightness on LT side, but also had good releases in both areas after STW and modalities.. Crrent goals are ongoing   Pt will benefit from skilled therapeutic intervention in order to improve on the following deficits Pain;Decreased activity tolerance   Rehab Potential Good   PT Frequency 3x / week   PT Duration 6 weeks   PT Treatment/Interventions ADLs/Self Care Home  Management;Cryotherapy;Electrical Stimulation;Moist Heat;Ultrasound;Therapeutic activities;Therapeutic exercise;Manual techniques   PT Next Visit Plan Right SDLY position with pillows between knees--Please perform U/S f/b STW/M to left low back musculature.  Progress to core exercises.  Re-check left leg length--if not equal patient will need to perform a left single knee to chest estretch.   Consulted and Agree with Plan of Care Patient        Problem List Patient Active Problem List   Diagnosis Date Noted  . Hot flashes, menopausal 05/02/2013  . Nicotine addiction 05/02/2013  . Counseling for estrogen replacement therapy 05/02/2013  . HSV (herpes simplex virus) infection 01/16/2013  . Libido, decreased 01/16/2013    RAMSEUR,CHRIS, PTA 09/20/2014, 11:38 AM  [facility] [address] [place], [place], [address] Phone: [phone]   Fax:  [phone]

## 2014-09-24 ENCOUNTER — Ambulatory Visit: Payer: Medicare Other | Admitting: Physical Therapy

## 2014-09-24 ENCOUNTER — Encounter: Payer: Self-pay | Admitting: Physical Therapy

## 2014-09-24 DIAGNOSIS — R5381 Other malaise: Secondary | ICD-10-CM

## 2014-09-24 DIAGNOSIS — M545 Low back pain: Secondary | ICD-10-CM | POA: Diagnosis not present

## 2014-09-24 DIAGNOSIS — M5386 Other specified dorsopathies, lumbar region: Secondary | ICD-10-CM

## 2014-09-24 NOTE — Therapy (Signed)
[facility] [address] [place], [place], [address] Phone: [phone]   Fax:  [phone]  Physical Therapy Treatment  Patient Details  Name: Jacqueline Phillips MRN: [id number] Date of Birth: 1960-07-06 Referring Provider:  Janene Madeira, MD  Encounter Date: 09/24/2014      PT End of Session - 09/24/14 0828    Visit Number 3   Number of Visits 18   Date for PT Re-Evaluation 11/12/14   PT Start Time 0817   PT Stop Time 0902   PT Time Calculation (min) 45 min   Activity Tolerance Patient tolerated treatment well   Behavior During Therapy [facility] for tasks assessed/performed      Past Medical History  Diagnosis Date  . Neuromuscular disorder     neuropathy  . Fibromyalgia   . HSV (herpes simplex virus) infection   . Anxiety   . Hot flashes, menopausal 05/02/2013  . Nicotine addiction 05/02/2013  . Counseling for estrogen replacement therapy 05/02/2013    Past Surgical History  Procedure Laterality Date  . Spinal fusion    . Neck surgery    . Carpal tunnel release    . Cesarean section    . Abdominal hysterectomy    . Knee surgery    . Tubal ligation      There were no vitals filed for this visit.  Visit Diagnosis:  Left low back pain, with sciatica presence unspecified  Decreased range of motion of lumbar spine  Debility      Subjective Assessment - 09/24/14 0827    Subjective Reports increased pain that goes all the way to her L foot today. Gets relief sometimes in prone.   Limitations Sitting;Walking   How long can you sit comfortably? 15-20 minutes   Patient Stated Goals Decrease pain in my back and left leg.   Currently in Pain? Yes   Pain Score 8    Pain Location Back   Pain Orientation Left;Lower   Pain Descriptors / Indicators Sharp   Pain Onset More than a month ago                         [facility] Adult PT Treatment/Exercise - 09/24/14 0001    Modalities   Modalities Electrical  Stimulation;Ultrasound   Programme researcher, broadcasting/film/video Location L SI Joint   Electrical Stimulation Action Pre-Mod   Electrical Stimulation Parameters 80-150 Hz x15 min   Electrical Stimulation Goals Pain   Ultrasound   Ultrasound Location L lumbar paraspinals/ QL   Ultrasound Parameters 1.5 w/cm2, 100%, 1 mhz   Ultrasound Goals Pain   Manual Therapy   Manual Therapy Myofascial release   Myofascial Release STW/ TPR to L lumbar paraspinals, QL, SI joint region to decrease pain and tightness                  PT Short Term Goals - 09/17/14 1104    PT SHORT TERM GOAL #1   Title Ind with initial HEP.   Time 2   Period Weeks   Status New           PT Long Term Goals - 09/17/14 1106    PT LONG TERM GOAL #1   Title Ind with an advanced HEP.   Time 6   Period Weeks   Status New   PT LONG TERM GOAL #2   Title Sit 30 minutes with pain not > 3-4/10.   Time 6  Period Weeks   Status New   PT LONG TERM GOAL #3   Title Stand 30 minutes with pain not > 3-4/10.   Time 6   Period Weeks   Status New   PT LONG TERM GOAL #4   Title Perform ADL's with pain not > 4/10.   Time 6   Period Weeks   Status New   PT LONG TERM GOAL #5   Title Sleep undisturbed 6 hours.   Time 6   Period Weeks   Status New               Plan - 09/24/14 7829    Clinical Impression Statement Patient did well with treatment today with no increased pain expressed by patient during treatment. Manual therapy and [place] completed in R sidelying with 2 pillows between knees and stimulation completed in prone secondary to patient wishes. Experienced tenderness around the L SI joint during manual therapy and moderate tightness in L lumbar paraspinals and minimal TPs observed. Minimal to moderate tightness tolerated by patient without complaint. Normal modalties respones noted following removal of the modalties. Experienced 3/10 pain following treatment.   Pt will benefit from skilled  therapeutic intervention in order to improve on the following deficits Pain;Decreased activity tolerance   Rehab Potential Good   PT Frequency 3x / week   PT Duration 6 weeks   PT Treatment/Interventions ADLs/Self Care Home Management;Cryotherapy;Electrical Stimulation;Moist Heat;Ultrasound;Therapeutic activities;Therapeutic exercise;Manual techniques   PT Next Visit Plan Right SDLY position with pillows between knees--Please perform U/S f/b STW/M to left low back musculature.  Progress to core exercises.  Re-check left leg length--if not equal patient will need to perform a left single knee to chest estretch.   Consulted and Agree with Plan of Care Patient        Problem List Patient Active Problem List   Diagnosis Date Noted  . Hot flashes, menopausal 05/02/2013  . Nicotine addiction 05/02/2013  . Counseling for estrogen replacement therapy 05/02/2013  . HSV (herpes simplex virus) infection 01/16/2013  . Libido, decreased 01/16/2013    Evelene Croon, PTA 09/24/2014, 9:13 AM  [facility] [address] [place], [place], [address] Phone: [phone]   Fax:  [phone]

## 2014-09-26 ENCOUNTER — Encounter: Payer: Self-pay | Admitting: Physical Therapy

## 2014-09-26 ENCOUNTER — Ambulatory Visit: Payer: Medicare Other | Admitting: Physical Therapy

## 2014-09-26 DIAGNOSIS — M5386 Other specified dorsopathies, lumbar region: Secondary | ICD-10-CM

## 2014-09-26 DIAGNOSIS — M545 Low back pain: Secondary | ICD-10-CM

## 2014-09-26 DIAGNOSIS — R5381 Other malaise: Secondary | ICD-10-CM

## 2014-09-26 NOTE — Therapy (Addendum)
[facility] [address] [place], [place], [address] Phone: [phone]   Fax:  [phone]  Physical Therapy Treatment  Patient Details  Name: Jacqueline Phillips MRN: [id number] Date of Birth: [birth year] Referring Provider:  Janene Madeira, MD  Encounter Date: 09/26/2014      PT End of Session - 09/26/14 0820    Visit Number 4   Number of Visits 18   Date for PT Re-Evaluation 11/12/14   PT Start Time 0816   PT Stop Time 0859   PT Time Calculation (min) 43 min   Activity Tolerance Patient tolerated treatment well   Behavior During Therapy [facility] for tasks assessed/performed      Past Medical History  Diagnosis Date  . Neuromuscular disorder     neuropathy  . Fibromyalgia   . HSV (herpes simplex virus) infection   . Anxiety   . Hot flashes, menopausal 05/02/2013  . Nicotine addiction 05/02/2013  . Counseling for estrogen replacement therapy 05/02/2013    Past Surgical History  Procedure Laterality Date  . Spinal fusion    . Neck surgery    . Carpal tunnel release    . Cesarean section    . Abdominal hysterectomy    . Knee surgery    . Tubal ligation      There were no vitals filed for this visit.  Visit Diagnosis:  Left low back pain, with sciatica presence unspecified  Decreased range of motion of lumbar spine  Debility      Subjective Assessment - 09/26/14 0818    Subjective Reports that her back feels better but still has some pain when she walks on LLE. Reports that cream that was mailed to her house that is a mix between Meloxicam and other medications has not had any benefits for her after she tried it for a week where the pain is experienced in her L low back.   Limitations Sitting;Walking   How long can you sit comfortably? 15-20 minutes   Patient Stated Goals Decrease pain in my back and left leg.   Currently in Pain? Yes   Pain Score 4    Pain Location Back   Pain Orientation Left;Lower   Pain Descriptors  / Indicators Sore;Other (Comment)  Aggravating   Pain Onset More than a month ago            [facility] PT Assessment - 09/26/14 0001    Assessment   Medical Diagnosis Back and leg pain.                     [facility] Adult PT Treatment/Exercise - 09/26/14 0001    Modalities   Modalities Electrical Stimulation;Ultrasound   Programme researcher, broadcasting/film/video Location L SI Joint   Electrical Stimulation Action Pre-Mod   Electrical Stimulation Parameters 80-150 Hz x15 min   Electrical Stimulation Goals Pain   Ultrasound   Ultrasound Location L lumbar paraspinals, QL   Ultrasound Parameters 1.5 w/cm2, 100%, 1 mhz   Ultrasound Goals Pain   Manual Therapy   Manual Therapy Myofascial release   Myofascial Release STW/ TPR to L lumbar paraspinals, QL, SI joint region to decrease pain and tightness                  PT Short Term Goals - 09/17/14 1104    PT SHORT TERM GOAL #1   Title Ind with initial HEP.   Time 2   Period Weeks   Status  New           PT Long Term Goals - 09/17/14 1106    PT LONG TERM GOAL #1   Title Ind with an advanced HEP.   Time 6   Period Weeks   Status New   PT LONG TERM GOAL #2   Title Sit 30 minutes with pain not > 3-4/10.   Time 6   Period Weeks   Status New   PT LONG TERM GOAL #3   Title Stand 30 minutes with pain not > 3-4/10.   Time 6   Period Weeks   Status New   PT LONG TERM GOAL #4   Title Perform ADL's with pain not > 4/10.   Time 6   Period Weeks   Status New   PT LONG TERM GOAL #5   Title Sleep undisturbed 6 hours.   Time 6   Period Weeks   Status New               Plan - 09/26/14 0900    Clinical Impression Statement Patient tolerated treatment well today with some tenderness noted around the L posterior hip, lateral to the L SI joint. Normal modalities response noted following removal of the modalities. Moderate tightness and TPs noted in the L posterior hip and lower lumbar  musculature. Moderate  pressure tolerated by patient today during manual therapy. Experienced 2/10 pain following treatment.   Pt will benefit from skilled therapeutic intervention in order to improve on the following deficits Pain;Decreased activity tolerance   Rehab Potential Good   PT Frequency 3x / week   PT Duration 6 weeks   PT Treatment/Interventions ADLs/Self Care Home Management;Cryotherapy;Electrical Stimulation;Moist Heat;Ultrasound;Therapeutic activities;Therapeutic exercise;Manual techniques   PT Next Visit Plan Right SDLY position with pillows between knees--Please perform U/S f/b STW/M to left low back musculature.  Progress to core exercises.  Re-check left leg length--if not equal patient will need to perform a left single knee to chest estretch.   Consulted and Agree with Plan of Care Patient        Problem List Patient Active Problem List   Diagnosis Date Noted  . Hot flashes, menopausal 05/02/2013  . Nicotine addiction 05/02/2013  . Counseling for estrogen replacement therapy 05/02/2013  . HSV (herpes simplex virus) infection 01/16/2013  . Libido, decreased 01/16/2013    Evelene Croon, PTA 09/26/2014, 10:15 AM  [facility] [address] [place], [place], [address] Phone: [phone]   Fax:  [phone]

## 2014-10-01 ENCOUNTER — Ambulatory Visit: Payer: Medicare Other | Attending: Physician Assistant | Admitting: Physical Therapy

## 2014-10-01 ENCOUNTER — Encounter: Payer: Self-pay | Admitting: Physical Therapy

## 2014-10-01 DIAGNOSIS — M2560 Stiffness of unspecified joint, not elsewhere classified: Secondary | ICD-10-CM | POA: Insufficient documentation

## 2014-10-01 DIAGNOSIS — M545 Low back pain: Secondary | ICD-10-CM

## 2014-10-01 DIAGNOSIS — R5381 Other malaise: Secondary | ICD-10-CM

## 2014-10-01 DIAGNOSIS — M5386 Other specified dorsopathies, lumbar region: Secondary | ICD-10-CM

## 2014-10-01 NOTE — Therapy (Addendum)
[facility] [address] [place], [place], [address] Phone: [phone]   Fax:  [phone]  Physical Therapy Treatment  Patient Details  Name: Jacqueline Phillips MRN: [id number] Date of Birth: 1961-01-27 Referring Provider:  Janene Madeira, MD  Encounter Date: 10/01/2014      PT End of Session - 10/01/14 1439    Visit Number 5   Number of Visits 18   Date for PT Re-Evaluation 11/12/14   PT Start Time 1432   PT Stop Time 1516   PT Time Calculation (min) 44 min   Activity Tolerance Patient tolerated treatment well   Behavior During Therapy [facility] for tasks assessed/performed      Past Medical History  Diagnosis Date  . Neuromuscular disorder     neuropathy  . Fibromyalgia   . HSV (herpes simplex virus) infection   . Anxiety   . Hot flashes, menopausal 05/02/2013  . Nicotine addiction 05/02/2013  . Counseling for estrogen replacement therapy 05/02/2013    Past Surgical History  Procedure Laterality Date  . Spinal fusion    . Neck surgery    . Carpal tunnel release    . Cesarean section    . Abdominal hysterectomy    . Knee surgery    . Tubal ligation      There were no vitals filed for this visit.  Visit Diagnosis:  Left low back pain, with sciatica presence unspecified  Decreased range of motion of lumbar spine  Debility      Subjective Assessment - 10/01/14 1437    Subjective Reports that she fell about 2:30 am over a baby walker into the walker due to a pain in the L hip area. Has purple/blue bruise around the distolateral L thigh. Reports that yesterday she washed several loads of towels prior to an upcoming move and did 3 loads of laundry this morning. All loads were hung up as well with some discomfort.   Limitations Sitting;Walking   How long can you sit comfortably? 15-20 minutes   Patient Stated Goals Decrease pain in my back and left leg.   Currently in Pain? Yes   Pain Score 4    Pain Location Back   Pain  Orientation Left;Lower   Pain Descriptors / Indicators Sore   Pain Onset More than a month ago            [facility] PT Assessment - 10/01/14 0001    Assessment   Medical Diagnosis Back and leg pain.                     [facility] Adult PT Treatment/Exercise - 10/01/14 0001    Modalities   Modalities Electrical Stimulation;Ultrasound   Programme researcher, broadcasting/film/video Location L SI Joint   Electrical Stimulation Action Pre-Mod   Electrical Stimulation Parameters 80-150 H x15 min in prone   Electrical Stimulation Goals Pain   Ultrasound   Ultrasound Location L lumbar paraspinals/ SI Joint   Ultrasound Parameters 1.5 w/cm2,  100%, 1 mhz   Ultrasound Goals Pain   Manual Therapy   Manual Therapy Myofascial release   Myofascial Release STW/ TPR to L lumbar paraspinals, QL, SI joint region to decrease pain and tightness                  PT Short Term Goals - 09/17/14 1104    PT SHORT TERM GOAL #1   Title Ind with initial HEP.   Time 2  Period Weeks   Status New           PT Long Term Goals - 09/17/14 1106    PT LONG TERM GOAL #1   Title Ind with an advanced HEP.   Time 6   Period Weeks   Status New   PT LONG TERM GOAL #2   Title Sit 30 minutes with pain not > 3-4/10.   Time 6   Period Weeks   Status New   PT LONG TERM GOAL #3   Title Stand 30 minutes with pain not > 3-4/10.   Time 6   Period Weeks   Status New   PT LONG TERM GOAL #4   Title Perform ADL's with pain not > 4/10.   Time 6   Period Weeks   Status New   PT LONG TERM GOAL #5   Title Sleep undisturbed 6 hours.   Time 6   Period Weeks   Status New               Plan - 10/01/14 1523    Clinical Impression Statement Patient tolerated treatment well today although she had tenderness and soreness around the L SI joint and posterior hip which may have stemmed from patient's fall at home earlier today. Normal modalities response noted following removal of the  modalities. Moderate tightness noted in the L lumbar paraspinals, QL. Gentle STW completed over the L SI joint and posterior hip secondary to the soreness and tenderness experienced. Minimal to moderate pressure tolerated by patient during manual therapy today. FOTO for 5th visit not completed secondary to patient on phone during treatment. Experienced 1/10 pain following treatment.   Pt will benefit from skilled therapeutic intervention in order to improve on the following deficits Pain;Decreased activity tolerance   Rehab Potential Good   PT Frequency 3x / week   PT Duration 6 weeks   PT Treatment/Interventions ADLs/Self Care Home Management;Cryotherapy;Electrical Stimulation;Moist Heat;Ultrasound;Therapeutic activities;Therapeutic exercise;Manual techniques   PT Next Visit Plan Right SDLY position with pillows between knees--Please perform U/S f/b STW/M to left low back musculature.  Progress to core exercises.  Re-check left leg length--if not equal patient will need to perform a left single knee to chest estretch.   Consulted and Agree with Plan of Care Patient        Problem List Patient Active Problem List   Diagnosis Date Noted  . Hot flashes, menopausal 05/02/2013  . Nicotine addiction 05/02/2013  . Counseling for estrogen replacement therapy 05/02/2013  . HSV (herpes simplex virus) infection 01/16/2013  . Libido, decreased 01/16/2013    Evelene Croon, PTA 10/01/2014, 3:33 PM  [facility] [address] [place], [place], [address] Phone: [phone]   Fax:  [phone]

## 2014-10-03 ENCOUNTER — Ambulatory Visit: Payer: Medicare Other | Admitting: Physical Therapy

## 2014-10-03 ENCOUNTER — Encounter: Payer: Self-pay | Admitting: Physical Therapy

## 2014-10-03 DIAGNOSIS — M545 Low back pain: Secondary | ICD-10-CM | POA: Diagnosis not present

## 2014-10-03 DIAGNOSIS — M5386 Other specified dorsopathies, lumbar region: Secondary | ICD-10-CM

## 2014-10-03 DIAGNOSIS — R5381 Other malaise: Secondary | ICD-10-CM

## 2014-10-03 NOTE — Therapy (Signed)
[facility] [address] [place], [place], [address] Phone: [phone]   Fax:  [phone]  Physical Therapy Treatment  Patient Details  Name: Jacqueline Phillips MRN: [id number] Date of Birth: [birth year] Referring Provider:  Janene Madeira, MD  Encounter Date: 10/03/2014      PT End of Session - 10/03/14 1439    Visit Number 6   Number of Visits 18   Date for PT Re-Evaluation 11/12/14   PT Start Time 1434   PT Stop Time 1519   PT Time Calculation (min) 45 min   Activity Tolerance Patient tolerated treatment well   Behavior During Therapy [facility] for tasks assessed/performed      Past Medical History  Diagnosis Date  . Neuromuscular disorder     neuropathy  . Fibromyalgia   . HSV (herpes simplex virus) infection   . Anxiety   . Hot flashes, menopausal 05/02/2013  . Nicotine addiction 05/02/2013  . Counseling for estrogen replacement therapy 05/02/2013    Past Surgical History  Procedure Laterality Date  . Spinal fusion    . Neck surgery    . Carpal tunnel release    . Cesarean section    . Abdominal hysterectomy    . Knee surgery    . Tubal ligation      There were no vitals filed for this visit.  Visit Diagnosis:  Left low back pain, with sciatica presence unspecified  Decreased range of motion of lumbar spine  Debility      Subjective Assessment - 10/03/14 1438    Subjective Reports that she is tired, is preparing to move. Bruises are fading from her fall over the baby walker on her arm. States that following previous treatment the pain returned as if she hadn't been to PT. States she has been in bed today with a migraine and reported increased pain with laying on plinth in prone with pain down L thigh.   Limitations Sitting;Walking   How long can you sit comfortably? 15-20 minutes   Patient Stated Goals Decrease pain in my back and left leg.   Currently in Pain? Yes   Pain Score 4    Pain Location Back   Pain  Orientation Left;Lower   Pain Onset More than a month ago                         [facility] Adult PT Treatment/Exercise - 10/03/14 0001    Modalities   Modalities Electrical Stimulation;Ultrasound   Programme researcher, broadcasting/film/video Location L SI Joint   Electrical Stimulation Action Pre-Mod   Electrical Stimulation Parameters 80-150 Hz x61min in prone/ L sidelying   Electrical Stimulation Goals Pain   Ultrasound   Ultrasound Location L lumbar/ SI joint   Ultrasound Parameters 1.5 w/cm2, 100%, 1 mhz   Ultrasound Goals Pain   Manual Therapy   Manual Therapy Myofascial release   Myofascial Release STW/ TPR to L lumbar paraspinals, QL, SI joint region to decrease pain and tightness                  PT Short Term Goals - 09/17/14 1104    PT SHORT TERM GOAL #1   Title Ind with initial HEP.   Time 2   Period Weeks   Status New           PT Long Term Goals - 09/17/14 1106    PT LONG TERM GOAL #1  Title Ind with an advanced HEP.   Time 6   Period Weeks   Status New   PT LONG TERM GOAL #2   Title Sit 30 minutes with pain not > 3-4/10.   Time 6   Period Weeks   Status New   PT LONG TERM GOAL #3   Title Stand 30 minutes with pain not > 3-4/10.   Time 6   Period Weeks   Status New   PT LONG TERM GOAL #4   Title Perform ADL's with pain not > 4/10.   Time 6   Period Weeks   Status New   PT LONG TERM GOAL #5   Title Sleep undisturbed 6 hours.   Time 6   Period Weeks   Status New               Plan - 10/03/14 1525    Clinical Impression Statement Patient tolerated treatment well although she had increased pain that increased laying in prone. Continues to have soreness and tenderness over the L SI joint. Normal modalities response noted following removal of the modalites. Notable tightness in L lumbar paraspinals with sacral fat pads observed over the L SI joint. Experienced 4/10 pain following treatment today.   Pt will  benefit from skilled therapeutic intervention in order to improve on the following deficits Pain;Decreased activity tolerance   Rehab Potential Good   PT Frequency 3x / week   PT Duration 6 weeks   PT Treatment/Interventions ADLs/Self Care Home Management;Cryotherapy;Electrical Stimulation;Moist Heat;Ultrasound;Therapeutic activities;Therapeutic exercise;Manual techniques   PT Next Visit Plan Right SDLY position with pillows between knees--Please perform U/S f/b STW/M to left low back musculature.  Progress to core exercises.  Re-check left leg length--if not equal patient will need to perform a left single knee to chest estretch.   Consulted and Agree with Plan of Care Patient        Problem List Patient Active Problem List   Diagnosis Date Noted  . Hot flashes, menopausal 05/02/2013  . Nicotine addiction 05/02/2013  . Counseling for estrogen replacement therapy 05/02/2013  . HSV (herpes simplex virus) infection 01/16/2013  . Libido, decreased 01/16/2013    Evelene Croon, PTA 10/03/2014, 3:30 PM  [facility] [address] [place], [place], [address] Phone: [phone]   Fax:  [phone]

## 2014-10-08 ENCOUNTER — Encounter: Payer: Self-pay | Admitting: Physical Therapy

## 2014-10-08 ENCOUNTER — Ambulatory Visit: Payer: Medicare Other | Admitting: Physical Therapy

## 2014-10-08 DIAGNOSIS — M545 Low back pain: Secondary | ICD-10-CM

## 2014-10-08 DIAGNOSIS — M5386 Other specified dorsopathies, lumbar region: Secondary | ICD-10-CM

## 2014-10-08 DIAGNOSIS — R5381 Other malaise: Secondary | ICD-10-CM

## 2014-10-08 NOTE — Therapy (Signed)
[facility] [address] [place], [place], [address] Phone: [phone]   Fax:  [phone]  Physical Therapy Treatment  Patient Details  Name: Jacqueline Phillips MRN: [id number] Date of Birth: Sep 18, 1960 Referring Provider:  Janene Madeira, MD  Encounter Date: 10/08/2014      PT End of Session - 10/08/14 1506    Visit Number 7   Number of Visits 18   Date for PT Re-Evaluation 11/12/14   PT Start Time 1435   PT Stop Time 1518   PT Time Calculation (min) 43 min   Activity Tolerance Patient tolerated treatment well   Behavior During Therapy [facility] for tasks assessed/performed      Past Medical History  Diagnosis Date  . Neuromuscular disorder     neuropathy  . Fibromyalgia   . HSV (herpes simplex virus) infection   . Anxiety   . Hot flashes, menopausal 05/02/2013  . Nicotine addiction 05/02/2013  . Counseling for estrogen replacement therapy 05/02/2013    Past Surgical History  Procedure Laterality Date  . Spinal fusion    . Neck surgery    . Carpal tunnel release    . Cesarean section    . Abdominal hysterectomy    . Knee surgery    . Tubal ligation      There were no vitals filed for this visit.  Visit Diagnosis:  Left low back pain, with sciatica presence unspecified  Decreased range of motion of lumbar spine  Debility      Subjective Assessment - 10/08/14 1504    Subjective Reports she has had increased pain especially down the lateral R hip and thigh. Says that the MD told her previously that she had some scoliosis and patient is interested in where the MD took the bone graft from duirng her back surgery.   Limitations Sitting;Walking   How long can you sit comfortably? 15-20 minutes   Patient Stated Goals Decrease pain in my back and left leg.   Currently in Pain? Yes   Pain Score 6    Pain Location Back   Pain Orientation Left;Lower   Pain Descriptors / Indicators Tightness   Pain Onset More than a month ago             [facility] PT Assessment - 10/08/14 0001    Assessment   Medical Diagnosis Back and leg pain.                     [facility] Adult PT Treatment/Exercise - 10/08/14 0001    Modalities   Modalities Electrical Stimulation;Ultrasound   Programme researcher, broadcasting/film/video Location L SI joint   Electrical Stimulation Action Pre-Mod   Electrical Stimulation Parameters 80-150 Hz x15 min   Electrical Stimulation Goals Pain   Ultrasound   Ultrasound Location L posterior hip/ SI Joint   Ultrasound Parameters 1.5 w/cm2, 100%, 1 mhz   Ultrasound Goals Pain   Manual Therapy   Manual Therapy Myofascial release   Myofascial Release STW/ TPR to L lumbar paraspinals, QL, SI joint region to decrease pain and tightness                  PT Short Term Goals - 09/17/14 1104    PT SHORT TERM GOAL #1   Title Ind with initial HEP.   Time 2   Period Weeks   Status New           PT Long Term Goals - 09/17/14  1106    PT LONG TERM GOAL #1   Title Ind with an advanced HEP.   Time 6   Period Weeks   Status New   PT LONG TERM GOAL #2   Title Sit 30 minutes with pain not > 3-4/10.   Time 6   Period Weeks   Status New   PT LONG TERM GOAL #3   Title Stand 30 minutes with pain not > 3-4/10.   Time 6   Period Weeks   Status New   PT LONG TERM GOAL #4   Title Perform ADL's with pain not > 4/10.   Time 6   Period Weeks   Status New   PT LONG TERM GOAL #5   Title Sleep undisturbed 6 hours.   Time 6   Period Weeks   Status New               Plan - 10/08/14 1509    Clinical Impression Statement Patient tolerated treatment well although she had reports of soreness and tenderness during manual therapy over the L SI Joint. Normal modalities response noted following removal of the modalities. Continued tightness noted in the L musculature compared to the R musculature in the low back. Experienced 6/10 pain following treatment.   Pt will benefit  from skilled therapeutic intervention in order to improve on the following deficits Pain;Decreased activity tolerance   Rehab Potential Good   PT Frequency 3x / week   PT Duration 6 weeks   PT Treatment/Interventions ADLs/Self Care Home Management;Cryotherapy;Electrical Stimulation;Moist Heat;Ultrasound;Therapeutic activities;Therapeutic exercise;Manual techniques   PT Next Visit Plan Right SDLY position with pillows between knees--Please perform U/S f/b STW/M to left low back musculature.  Progress to core exercises.  Re-check left leg length--if not equal patient will need to perform a left single knee to chest estretch.   Consulted and Agree with Plan of Care Patient        Problem List Patient Active Problem List   Diagnosis Date Noted  . Hot flashes, menopausal 05/02/2013  . Nicotine addiction 05/02/2013  . Counseling for estrogen replacement therapy 05/02/2013  . HSV (herpes simplex virus) infection 01/16/2013  . Libido, decreased 01/16/2013    Evelene Croon, PTA 10/08/2014, 3:28 PM  [facility] [address] [place], [place], [address] Phone: [phone]   Fax:  [phone]

## 2014-10-10 ENCOUNTER — Ambulatory Visit: Payer: Medicare Other | Admitting: Physical Therapy

## 2014-10-10 ENCOUNTER — Encounter: Payer: Self-pay | Admitting: Physical Therapy

## 2014-10-10 DIAGNOSIS — R5381 Other malaise: Secondary | ICD-10-CM

## 2014-10-10 DIAGNOSIS — M545 Low back pain: Secondary | ICD-10-CM

## 2014-10-10 DIAGNOSIS — M5386 Other specified dorsopathies, lumbar region: Secondary | ICD-10-CM

## 2014-10-10 NOTE — Therapy (Signed)
[facility] [address] [place], [place], [address] Phone: [phone]   Fax:  [phone]  Physical Therapy Treatment  Patient Details  Name: Jacqueline Phillips MRN: [id number] Date of Birth: [birth year] Referring Provider:  Janene Madeira, MD  Encounter Date: 10/10/2014      PT End of Session - 10/10/14 1435    Visit Number 8   Number of Visits 18   Date for PT Re-Evaluation 11/12/14   PT Start Time 1430   PT Stop Time 1518   PT Time Calculation (min) 48 min   Activity Tolerance Patient tolerated treatment well   Behavior During Therapy [facility] for tasks assessed/performed      Past Medical History  Diagnosis Date  . Neuromuscular disorder     neuropathy  . Fibromyalgia   . HSV (herpes simplex virus) infection   . Anxiety   . Hot flashes, menopausal 05/02/2013  . Nicotine addiction 05/02/2013  . Counseling for estrogen replacement therapy 05/02/2013    Past Surgical History  Procedure Laterality Date  . Spinal fusion    . Neck surgery    . Carpal tunnel release    . Cesarean section    . Abdominal hysterectomy    . Knee surgery    . Tubal ligation      There were no vitals filed for this visit.  Visit Diagnosis:  Left low back pain, with sciatica presence unspecified  Debility  Decreased range of motion of lumbar spine      Subjective Assessment - 10/10/14 1433    Subjective Reports difficulty sleeping due to pain although she takes Xanax and Tramadol. Indicates pain into LLE again today. Has pain weightbearing on LLE while climbing stairs. States she has thought about getting massage.   Limitations Sitting;Walking   How long can you sit comfortably? 15-20 minutes   Patient Stated Goals Decrease pain in my back and left leg.   Currently in Pain? Yes   Pain Score 7    Pain Location Back   Pain Orientation Left;Lower   Pain Descriptors / Indicators Sore;Shooting;Sharp;Tightness  Mostly sore and tight   Pain Onset  More than a month ago   Pain Frequency Constant            [facility] PT Assessment - 10/10/14 0001    Assessment   Medical Diagnosis Back and leg pain.                     [facility] Adult PT Treatment/Exercise - 10/10/14 0001    Modalities   Modalities Electrical Stimulation;Ultrasound   Programme researcher, broadcasting/film/video Location L SI joint   Electrical Stimulation Action Pre-Mod   Electrical Stimulation Parameters 80-150 Hz x15 min   Electrical Stimulation Goals Pain   Ultrasound   Ultrasound Location L posterior hip/ SI joint   Ultrasound Parameters 1.5 w/cm2, 100%, 1 mhz   Ultrasound Goals Pain   Manual Therapy   Manual Therapy Myofascial release   Myofascial Release STW/ TPR to L lumbar paraspinals, QL, SI joint region to decrease pain and tightness in prone with pillows under abdomen                  PT Short Term Goals - 09/17/14 1104    PT SHORT TERM GOAL #1   Title Ind with initial HEP.   Time 2   Period Weeks   Status New  PT Long Term Goals - 10/10/14 1444    PT LONG TERM GOAL #1   Title Ind with an advanced HEP.   Time 6   Period Weeks   Status On-going   PT LONG TERM GOAL #2   Title Sit 30 minutes with pain not > 3-4/10.   Time 6   Period Weeks   Status On-going   PT LONG TERM GOAL #3   Title Stand 30 minutes with pain not > 3-4/10.   Time 6   Period Weeks   Status On-going   PT LONG TERM GOAL #4   Title Perform ADL's with pain not > 4/10.   Time 6   Period Weeks   Status On-going   PT LONG TERM GOAL #5   Title Sleep undisturbed 6 hours.   Time 6   Period Weeks   Status On-going               Plan - 10/10/14 1525    Clinical Impression Statement Patient tolerated treatment well although she experienced tenderness during deep MFR/STW of the L SI Joint, QL, paraspinals. Normal modalities response noted following removal of the modalities. Continues to demonstrate more tightness in the L  musculature than the R. Experienced 5/10 pain following treatment.   Pt will benefit from skilled therapeutic intervention in order to improve on the following deficits Pain;Decreased activity tolerance   Rehab Potential Good   PT Frequency 3x / week   PT Duration 6 weeks   PT Treatment/Interventions ADLs/Self Care Home Management;Cryotherapy;Electrical Stimulation;Moist Heat;Ultrasound;Therapeutic activities;Therapeutic exercise;Manual techniques   PT Next Visit Plan Right SDLY position with pillows between knees--Please perform U/S f/b STW/M to left low back musculature.  Progress to core exercises.  Re-check left leg length--if not equal patient will need to perform a left single knee to chest estretch.   Consulted and Agree with Plan of Care Patient        Problem List Patient Active Problem List   Diagnosis Date Noted  . Hot flashes, menopausal 05/02/2013  . Nicotine addiction 05/02/2013  . Counseling for estrogen replacement therapy 05/02/2013  . HSV (herpes simplex virus) infection 01/16/2013  . Libido, decreased 01/16/2013    Evelene Croon, PTA 10/10/2014, 3:28 PM  [facility] [address] [place], [place], [address] Phone: [phone]   Fax:  [phone]

## 2014-10-15 ENCOUNTER — Ambulatory Visit: Payer: Medicare Other | Admitting: Physical Therapy

## 2014-10-17 ENCOUNTER — Ambulatory Visit: Payer: Medicare Other | Admitting: Physical Therapy

## 2014-10-17 DIAGNOSIS — M545 Low back pain: Secondary | ICD-10-CM

## 2014-10-17 DIAGNOSIS — M5386 Other specified dorsopathies, lumbar region: Secondary | ICD-10-CM

## 2014-10-17 DIAGNOSIS — R5381 Other malaise: Secondary | ICD-10-CM

## 2014-10-17 NOTE — Therapy (Signed)
[facility] [address] [place], [place], [address] Phone: [phone]   Fax:  [phone]  Physical Therapy Treatment  Patient Details  Name: Jacqueline Phillips MRN: [id number] Date of Birth: April 30, 1960 Referring Provider:  Janene Madeira, MD  Encounter Date: 10/17/2014      PT End of Session - 10/17/14 1441    Visit Number 9   Number of Visits 18   Date for PT Re-Evaluation 11/12/14   PT Start Time 0145   PT Stop Time 0239   PT Time Calculation (min) 54 min   Activity Tolerance Patient tolerated treatment well   Behavior During Therapy [facility] for tasks assessed/performed      Past Medical History  Diagnosis Date  . Neuromuscular disorder     neuropathy  . Fibromyalgia   . HSV (herpes simplex virus) infection   . Anxiety   . Hot flashes, menopausal 05/02/2013  . Nicotine addiction 05/02/2013  . Counseling for estrogen replacement therapy 05/02/2013    Past Surgical History  Procedure Laterality Date  . Spinal fusion    . Neck surgery    . Carpal tunnel release    . Cesarean section    . Abdominal hysterectomy    . Knee surgery    . Tubal ligation      There were no vitals filed for this visit.  Visit Diagnosis:  Left low back pain, with sciatica presence unspecified  Debility  Decreased range of motion of lumbar spine      Subjective Assessment - 10/17/14 1406    Subjective My left leg hurt so bad I wanted to cut it off.   Pain Score 9    Pain Location Leg   Pain Orientation Left                                   PT Short Term Goals - 09/17/14 1104    PT SHORT TERM GOAL #1   Title Ind with initial HEP.   Time 2   Period Weeks   Status New           PT Long Term Goals - 10/10/14 1444    PT LONG TERM GOAL #1   Title Ind with an advanced HEP.   Time 6   Period Weeks   Status On-going   PT LONG TERM GOAL #2   Title Sit 30 minutes with pain not > 3-4/10.   Time 6   Period Weeks   Status On-going   PT LONG TERM GOAL #3   Title Stand 30 minutes with pain not > 3-4/10.   Time 6   Period Weeks   Status On-going   PT LONG TERM GOAL #4   Title Perform ADL's with pain not > 4/10.   Time 6   Period Weeks   Status On-going   PT LONG TERM GOAL #5   Title Sleep undisturbed 6 hours.   Time 6   Period Weeks   Status On-going        Treatment:  Pre-mod e stim x 15 minutes to left low back f/b prone over pillow position for left QL/sacral ligament release technique x 23 minutes.  Patient tolerated treatment without complaint.       Problem List Patient Active Problem List   Diagnosis Date Noted  . Hot flashes, menopausal 05/02/2013  . Nicotine addiction 05/02/2013  . Counseling for estrogen replacement  therapy 05/02/2013  . HSV (herpes simplex virus) infection 01/16/2013  . Libido, decreased 01/16/2013    Taeveon Keesling, [place] MPT 10/17/2014, 2:45 PM  [facility] [address] [place], [place], [address] Phone: [phone]   Fax:  [phone]

## 2014-10-21 ENCOUNTER — Other Ambulatory Visit: Payer: Self-pay | Admitting: *Deleted

## 2014-10-22 ENCOUNTER — Encounter: Payer: Medicare Other | Admitting: Physical Therapy

## 2014-10-22 MED ORDER — VALACYCLOVIR HCL 1 G PO TABS: 1000.0000 mg | ORAL_TABLET | Freq: Every day | ORAL | Status: DC

## 2014-10-24 ENCOUNTER — Ambulatory Visit: Payer: Medicare Other | Admitting: Physical Therapy

## 2014-10-24 DIAGNOSIS — M545 Low back pain: Secondary | ICD-10-CM | POA: Diagnosis not present

## 2014-10-24 DIAGNOSIS — R5381 Other malaise: Secondary | ICD-10-CM

## 2014-10-24 DIAGNOSIS — M5386 Other specified dorsopathies, lumbar region: Secondary | ICD-10-CM

## 2014-10-24 NOTE — Therapy (Signed)
[facility] [address] [place], [place], [address] Phone: [phone]   Fax:  [phone]  Physical Therapy Treatment  Patient Details  Name: Jacqueline Phillips MRN: [id number] Date of Birth: June 21, 1960 Referring Provider:  Janene Madeira, MD  Encounter Date: 10/24/2014      PT End of Session - 10/24/14 1904    Visit Number 10   Number of Visits 18   Date for PT Re-Evaluation 11/12/14   PT Start Time 0333   PT Stop Time 0430   PT Time Calculation (min) 57 min   Activity Tolerance Patient tolerated treatment well   Behavior During Therapy [facility] for tasks assessed/performed      Past Medical History  Diagnosis Date  . Neuromuscular disorder     neuropathy  . Fibromyalgia   . HSV (herpes simplex virus) infection   . Anxiety   . Hot flashes, menopausal 05/02/2013  . Nicotine addiction 05/02/2013  . Counseling for estrogen replacement therapy 05/02/2013    Past Surgical History  Procedure Laterality Date  . Spinal fusion    . Neck surgery    . Carpal tunnel release    . Cesarean section    . Abdominal hysterectomy    . Knee surgery    . Tubal ligation      There were no vitals filed for this visit.  Visit Diagnosis:  Left low back pain, with sciatica presence unspecified  Debility  Decreased range of motion of lumbar spine      Subjective Assessment - 10/24/14 1844    Subjective I got an injection in my left hip and it helped for one day.   Limitations Sitting;Walking   How long can you sit comfortably? 15-20 minutes   Patient Stated Goals Decrease pain in my back and left leg.                         [facility] Adult PT Treatment/Exercise - 10/24/14 0001    Electrical Stimulation   Electrical Stimulation Location left SIJ/low back.   Electrical Stimulation Action Pre-mod x 20 minutes.   Electrical Stimulation Parameters 80-150 HZ   Electrical Stimulation Goals Pain   Ultrasound   Ultrasound  Parameters 1.50 W/CM2 x 12 minutes.   Manual Therapy   Myofascial Release STW/M x 12 minutes.                  PT Short Term Goals - 09/17/14 1104    PT SHORT TERM GOAL #1   Title Ind with initial HEP.   Time 2   Period Weeks   Status New           PT Long Term Goals - 10/10/14 1444    PT LONG TERM GOAL #1   Title Ind with an advanced HEP.   Time 6   Period Weeks   Status On-going   PT LONG TERM GOAL #2   Title Sit 30 minutes with pain not > 3-4/10.   Time 6   Period Weeks   Status On-going   PT LONG TERM GOAL #3   Title Stand 30 minutes with pain not > 3-4/10.   Time 6   Period Weeks   Status On-going   PT LONG TERM GOAL #4   Title Perform ADL's with pain not > 4/10.   Time 6   Period Weeks   Status On-going   PT LONG TERM GOAL #5   Title Sleep undisturbed  6 hours.   Time 6   Period Weeks   Status On-going               Plan - 11-11-2014 1845    Clinical Impression Statement patient still in alot of pain.  She presented to the clinic today with a severly antalgic gait pattern andpain rated at 9-10/10.   Pt will benefit from skilled therapeutic intervention in order to improve on the following deficits Pain;Decreased activity tolerance   Rehab Potential Good   PT Treatment/Interventions ADLs/Self Care Home Management;Cryotherapy;Electrical Stimulation;Moist Heat;Ultrasound;Therapeutic activities;Therapeutic exercise;Manual techniques   PT Next Visit Plan Right SDLY position with pillows between knees--Please perform U/S f/b STW/M to left low back musculature.  Progress to core exercises.  Re-check left leg length--if not equal patient will need to perform a left single knee to chest estretch.          G-Codes - November 11, 2014 1905    Functional Assessment Tool Used Clinical judgement.   Functional Limitation Mobility: Walking and moving around   Mobility: Walking and Moving Around Current Status [phone]) At least 60 percent but less than 80 percent  impaired, limited or restricted   Mobility: Walking and Moving Around Goal Status [phone]) At least 40 percent but less than 60 percent impaired, limited or restricted      Problem List Patient Active Problem List   Diagnosis Date Noted  . Hot flashes, menopausal 05/02/2013  . Nicotine addiction 05/02/2013  . Counseling for estrogen replacement therapy 05/02/2013  . HSV (herpes simplex virus) infection 01/16/2013  . Libido, decreased 01/16/2013    Tifani Dack, [place] MPT 11-11-2014, 7:07 PM  [facility] [address] [place], [place], [address] Phone: [phone]   Fax:  [phone]

## 2014-10-28 ENCOUNTER — Ambulatory Visit: Payer: Medicare Other | Admitting: Physical Therapy

## 2014-10-28 DIAGNOSIS — M545 Low back pain: Secondary | ICD-10-CM | POA: Diagnosis not present

## 2014-10-28 DIAGNOSIS — M5386 Other specified dorsopathies, lumbar region: Secondary | ICD-10-CM

## 2014-10-28 DIAGNOSIS — R5381 Other malaise: Secondary | ICD-10-CM

## 2014-10-28 NOTE — Therapy (Signed)
[facility] [address] [place], [place], [address] Phone: [phone]   Fax:  [phone]  Physical Therapy Treatment  Patient Details  Name: Jacqueline Phillips MRN: [id number] Date of Birth: [birth year] Referring Provider:  Janene Madeira, MD  Encounter Date: 10/28/2014      PT End of Session - 10/28/14 1637    Visit Number 11   Number of Visits 18   Date for PT Re-Evaluation 11/12/14   PT Start Time 0320   PT Stop Time 0413   PT Time Calculation (min) 53 min   Activity Tolerance Patient tolerated treatment well   Behavior During Therapy [facility] for tasks assessed/performed      Past Medical History  Diagnosis Date  . Neuromuscular disorder     neuropathy  . Fibromyalgia   . HSV (herpes simplex virus) infection   . Anxiety   . Hot flashes, menopausal 05/02/2013  . Nicotine addiction 05/02/2013  . Counseling for estrogen replacement therapy 05/02/2013    Past Surgical History  Procedure Laterality Date  . Spinal fusion    . Neck surgery    . Carpal tunnel release    . Cesarean section    . Abdominal hysterectomy    . Knee surgery    . Tubal ligation      There were no vitals filed for this visit.  Visit Diagnosis:  Left low back pain, with sciatica presence unspecified  Debility  Decreased range of motion of lumbar spine      Subjective Assessment - 10/28/14 1635    Subjective Rode on motorcycle with my son.  Pain at a 7/10 today.  I was approved to get an epidural injection in my spine.   Limitations Sitting;Walking   How long can you sit comfortably? 15-20 minutes   Patient Stated Goals Decrease pain in my back and left leg.   Pain Score 7    Pain Location Leg   Pain Orientation Left   Pain Descriptors / Indicators Sore;Shooting;Sharp;Tightness   Pain Onset More than a month ago                                   PT Short Term Goals - 09/17/14 1104    PT SHORT TERM GOAL #1   Title  Ind with initial HEP.   Time 2   Period Weeks   Status New           PT Long Term Goals - 10/28/14 1640    PT LONG TERM GOAL #1   Title Ind with an advanced HEP.   Time 6   Period Weeks   Status On-going   PT LONG TERM GOAL #2   Title Sit 30 minutes with pain not > 3-4/10.   Time 6   Period Weeks   Status On-going   PT LONG TERM GOAL #3   Title Stand 30 minutes with pain not > 3-4/10.   Time 6   Period Weeks   Status On-going   PT LONG TERM GOAL #4   Title Perform ADL's with pain not > 4/10.   Time 6   Period Weeks   Status On-going               Plan - 10/28/14 1639    Clinical Impression Statement Patient continues to c/o a great deal of left sided low back pain with a rated pain-level at 7/10 (  9/10 last week).   Pt will benefit from skilled therapeutic intervention in order to improve on the following deficits Pain;Decreased activity tolerance   Rehab Potential Good   PT Frequency 2x / week   PT Duration 4 weeks   PT Treatment/Interventions ADLs/Self Care Home Management;Cryotherapy;Electrical Stimulation;Moist Heat;Ultrasound;Therapeutic activities;Therapeutic exercise;Manual techniques   PT Next Visit Plan Right SDLY position with pillows between knees--Please perform U/S f/b STW/M to left low back musculature.  Progress to core exercises.  Re-check left leg length--if not equal patient will need to perform a left single knee to chest estretch.   Consulted and Agree with Plan of Care Patient        Problem List Patient Active Problem List   Diagnosis Date Noted  . Hot flashes, menopausal 05/02/2013  . Nicotine addiction 05/02/2013  . Counseling for estrogen replacement therapy 05/02/2013  . HSV (herpes simplex virus) infection 01/16/2013  . Libido, decreased 01/16/2013  Treatment:  Prone over 2 pillows for comfort:  U/S at 1.50 W/CM2 x 12 minutes to patient's left low back f/b STW/M to left multifidus and left sacral ligaments f/b Pre-mod e'stim x  20 minutes in supine with legs elevated on cube with incline bolster.  Jacqueline Phillips, [place] MPT 10/28/2014, 5:01 PM  [facility] [address] [place], [place], [address] Phone: [phone]   Fax:  [phone]

## 2014-10-30 ENCOUNTER — Encounter: Payer: Medicare Other | Admitting: Physical Therapy

## 2014-11-11 DIAGNOSIS — I359 Nonrheumatic aortic valve disorder, unspecified: Secondary | ICD-10-CM | POA: Insufficient documentation

## 2014-11-11 DIAGNOSIS — R002 Palpitations: Secondary | ICD-10-CM | POA: Insufficient documentation

## 2014-11-11 DIAGNOSIS — E785 Hyperlipidemia, unspecified: Secondary | ICD-10-CM | POA: Insufficient documentation

## 2014-11-12 DIAGNOSIS — I340 Nonrheumatic mitral (valve) insufficiency: Secondary | ICD-10-CM | POA: Insufficient documentation

## 2015-02-11 DIAGNOSIS — E538 Deficiency of other specified B group vitamins: Secondary | ICD-10-CM | POA: Insufficient documentation

## 2015-02-11 DIAGNOSIS — F411 Generalized anxiety disorder: Secondary | ICD-10-CM | POA: Insufficient documentation

## 2015-02-11 DIAGNOSIS — K219 Gastro-esophageal reflux disease without esophagitis: Secondary | ICD-10-CM | POA: Insufficient documentation

## 2015-02-11 DIAGNOSIS — Z8639 Personal history of other endocrine, nutritional and metabolic disease: Secondary | ICD-10-CM | POA: Insufficient documentation

## 2015-02-11 DIAGNOSIS — M8580 Other specified disorders of bone density and structure, unspecified site: Secondary | ICD-10-CM | POA: Insufficient documentation

## 2015-03-20 DIAGNOSIS — M87052 Idiopathic aseptic necrosis of left femur: Secondary | ICD-10-CM | POA: Insufficient documentation

## 2015-03-31 DIAGNOSIS — M8949 Other hypertrophic osteoarthropathy, multiple sites: Secondary | ICD-10-CM | POA: Insufficient documentation

## 2015-03-31 DIAGNOSIS — M1612 Unilateral primary osteoarthritis, left hip: Secondary | ICD-10-CM | POA: Insufficient documentation

## 2015-03-31 DIAGNOSIS — M159 Polyosteoarthritis, unspecified: Secondary | ICD-10-CM | POA: Insufficient documentation

## 2015-03-31 HISTORY — PX: HIP SURGERY: SHX245

## 2015-04-06 ENCOUNTER — Other Ambulatory Visit: Payer: Self-pay | Admitting: Adult Health

## 2015-05-13 NOTE — Therapy (Signed)
[facility] [place], [place], [address] Phone: [phone]   Fax:  [phone]  Physical Therapy Treatment  Patient Details  Name: Jacqueline Phillips MRN: [id number] Date of Birth: 08-Jun-1960 No Data Recorded  Encounter Date: 10/28/2014    Past Medical History  Diagnosis Date  . Neuromuscular disorder     neuropathy  . Fibromyalgia   . HSV (herpes simplex virus) infection   . Anxiety   . Hot flashes, menopausal 05/02/2013  . Nicotine addiction 05/02/2013  . Counseling for estrogen replacement therapy 05/02/2013    Past Surgical History  Procedure Laterality Date  . Spinal fusion    . Neck surgery    . Carpal tunnel release    . Cesarean section    . Abdominal hysterectomy    . Knee surgery    . Tubal ligation      There were no vitals filed for this visit.  Visit Diagnosis:  Left low back pain, with sciatica presence unspecified  Debility  Decreased range of motion of lumbar spine                                 PT Short Term Goals - 09/17/14 1104    PT SHORT TERM GOAL #1   Title Ind with initial HEP.   Time 2   Period Weeks   Status New           PT Long Term Goals - 10/28/14 1640    PT LONG TERM GOAL #1   Title Ind with an advanced HEP.   Time 6   Period Weeks   Status On-going   PT LONG TERM GOAL #2   Title Sit 30 minutes with pain not > 3-4/10.   Time 6   Period Weeks   Status On-going   PT LONG TERM GOAL #3   Title Stand 30 minutes with pain not > 3-4/10.   Time 6   Period Weeks   Status On-going   PT LONG TERM GOAL #4   Title Perform ADL's with pain not > 4/10.   Time 6   Period Weeks   Status On-going               Problem List Patient Active Problem List   Diagnosis Date Noted  . Hot flashes, menopausal 05/02/2013  . Nicotine addiction 05/02/2013  . Counseling for estrogen replacement therapy 05/02/2013  . HSV (herpes simplex virus)  infection 01/16/2013  . Libido, decreased 01/16/2013   PHYSICAL THERAPY DISCHARGE SUMMARY  Visits from Start of Care: 11  Current functional level related to goals / functional outcomes: Please see above.   Remaining deficits: Continued low back pain.   Education / Equipment: HEP.  Plan: Patient agrees to discharge.  Patient goals were not met. Patient is being discharged due to not returning since the last visit.  ?????      APPLE[place], [name] 05/13/2015, 7:37 PM  [facility] [address] [place], [place], [address] Phone: [phone]   Fax:  [phone]  Name: BERDENA CISEK MRN: [id number] Date of Birth: 05-Apr-1960

## 2015-05-20 ENCOUNTER — Other Ambulatory Visit (HOSPITAL_COMMUNITY): Payer: Self-pay | Admitting: Internal Medicine

## 2015-05-20 DIAGNOSIS — Z1231 Encounter for screening mammogram for malignant neoplasm of breast: Secondary | ICD-10-CM

## 2015-05-22 ENCOUNTER — Ambulatory Visit (HOSPITAL_COMMUNITY): Payer: Medicare Other

## 2015-05-22 ENCOUNTER — Other Ambulatory Visit: Payer: Self-pay

## 2015-05-22 DIAGNOSIS — Z1231 Encounter for screening mammogram for malignant neoplasm of breast: Secondary | ICD-10-CM

## 2015-05-27 ENCOUNTER — Other Ambulatory Visit: Payer: Medicare Other | Admitting: Adult Health

## 2015-05-29 ENCOUNTER — Ambulatory Visit: Payer: Medicare Other

## 2015-06-02 ENCOUNTER — Ambulatory Visit (INDEPENDENT_AMBULATORY_CARE_PROVIDER_SITE_OTHER): Payer: Medicare Other | Admitting: Adult Health

## 2015-06-02 ENCOUNTER — Encounter: Payer: Self-pay | Admitting: Adult Health

## 2015-06-02 VITALS — BP 110/70 | HR 66 | Ht 64.0 in | Wt 128.0 lb

## 2015-06-02 DIAGNOSIS — Z01419 Encounter for gynecological examination (general) (routine) without abnormal findings: Secondary | ICD-10-CM

## 2015-06-02 DIAGNOSIS — Z1212 Encounter for screening for malignant neoplasm of rectum: Secondary | ICD-10-CM

## 2015-06-02 DIAGNOSIS — K589 Irritable bowel syndrome without diarrhea: Secondary | ICD-10-CM

## 2015-06-02 HISTORY — DX: Irritable bowel syndrome without diarrhea: K58.9

## 2015-06-02 LAB — HEMOCCULT GUIAC POC 1CARD (OFFICE): Fecal Occult Blood, POC: NEGATIVE

## 2015-06-02 MED ORDER — DICYCLOMINE HCL 10 MG PO CAPS: 10.0000 mg | ORAL_CAPSULE | Freq: Three times a day (TID) | ORAL | Status: DC

## 2015-06-02 NOTE — Progress Notes (Signed)
Patient ID: Jacqueline Phillips, female   DOB: May 23, 1960, 55 y.o.   MRN: [id number] History of Present Illness: Jacqueline Phillips is a 55 year old white female, in for well woman gyn exam, she is sp hysterectomy. PCP is Dr Corwin LevinsWilliam Smith.  Current Medications, Allergies, Past Medical History, Past Surgical History, Family History and Social History were reviewed in [name] electronic medical record.     Review of Systems: Patient denies any headaches, hearing loss, fatigue, blurred vision, shortness of breath, chest pain, abdominal pain, problems with urination, or intercourse. No joint pain or mood swings.She is having some diarrhea and constipation, and feels bloated at times, and pressure, had colonoscopy 06/22/10 and it was normal.She says BMs are green in am. She had left hip replacement 9 weeks ago and is doing well.Still smoking but only about 1/2 a pack.She says had labs with PCP and triglycerides were up and is cutting carbs.     Physical Exam:BP 110/70 mmHg  Pulse 66  Ht 5\' 4"  (1.626 m)  Wt 128 lb (58.06 kg)  BMI 21.96 kg/m2 General:  Well developed, well nourished, no acute distress Skin:  Warm and dry Neck:  Midline trachea, normal thyroid, good ROM, no lymphadenopathy Lungs; Clear to auscultation bilaterally Breast:  No dominant palpable mass, retraction, or nipple discharge Cardiovascular: Regular rate and rhythm Abdomen:  Soft, non tender, no hepatosplenomegaly Pelvic:  External genitalia is normal in appearance, no lesions.  The vagina is normal in appearance. Urethra has no lesions or masses, the cervix and uterus are absent.  No adnexal masses or tenderness noted.Bladder is non tender, no masses felt. Rectal: Good sphincter tone, no polyps, or hemorrhoids felt.  Hemoccult negative.Mild rectocele  Extremities/musculoskeletal:  No swelling or varicosities noted, no clubbing or cyanosis Psych:  No mood changes, alert and cooperative,seems happy   Impression: Well woman gyn exam no  pap IBS    Plan: Rx bentyl 10 mg #90 take tid with meals with 1 refill Get mammogram now is past due Labs with PCP Physical in 1 year Review handout on IBS and IBS diet and call me in follow up, if not better will refer to GI  Colonoscopy 2022

## 2015-06-02 NOTE — Patient Instructions (Signed)
Diet for Irritable Bowel Syndrome  When you have irritable bowel syndrome (IBS), the foods you eat and your eating habits are very important. IBS Schlachter cause various symptoms, such as abdominal pain, constipation, or diarrhea. Choosing the right foods can help ease discomfort caused by these symptoms. Work with your health care provider and dietitian to find the best eating plan to help control your symptoms.  WHAT GENERAL GUIDELINES DO I NEED TO FOLLOW?  · Keep a food diary. This will help you identify foods that cause symptoms. Write down:  · What you eat and when.  · What symptoms you have.  · When symptoms occur in relation to your meals.  · Avoid foods that cause symptoms. Talk with your dietitian about other ways to get the same nutrients that are in these foods.  · Eat more foods that contain fiber. Take a fiber supplement if directed by your dietitian.  · Eat your meals slowly, in a relaxed setting.  · Aim to eat 5-6 small meals per day. Do not skip meals.  · Drink enough fluids to keep your urine clear or pale yellow.  · Ask your health care provider if you should take an over-the-counter probiotic during flare-ups to help restore healthy gut bacteria.  · If you have cramping or diarrhea, try making your meals low in fat and high in carbohydrates. Examples of carbohydrates are pasta, rice, whole grain breads and cereals, fruits, and vegetables.  · If dairy products cause your symptoms to flare up, try eating less of them. You might be able to handle yogurt better than other dairy products because it contains bacteria that help with digestion.  WHAT FOODS ARE NOT RECOMMENDED?  The following are some foods and drinks that Mian worsen your symptoms:  · Fatty foods, such as French fries.  · Milk products, such as cheese or ice cream.  · Chocolate.  · Alcohol.  · Products with caffeine, such as coffee.  · Carbonated drinks, such as soda.  The items listed above Noviello not be a complete list of foods and beverages to  avoid. Contact your dietitian for more information.  WHAT FOODS ARE GOOD SOURCES OF FIBER?  Your health care provider or dietitian [place]ien recommend that you eat more foods that contain fiber. Fiber can help reduce constipation and other IBS symptoms. Add foods with fiber to your diet a little at a time so that your body can get used to them. Too much fiber at once might cause gas and swelling of your abdomen. The following are some foods that are good sources of fiber:  · Apples.  · Peaches.  · Pears.  · Berries.  · Figs.  · Broccoli (raw).  · Cabbage.  · Carrots.  · Raw peas.  · Kidney beans.  · Lima beans.  · Whole grain bread.  · Whole grain cereal.  FOR MORE INFORMATION   International Foundation for Functional Gastrointestinal Disorders: www.iffgd.org  National Institute of Diabetes and Digestive and Kidney Diseases: www.niddk.nih.gov/health-information/health-topics/digestive-diseases/ibs/Pages/facts.aspx     This information is not intended to replace advice given to you by your health care provider. Make sure you discuss any questions you have with your health care provider.     Document Released: 05/08/2003 Document Revised: 03/08/2014 Document Reviewed: 05/18/2013  Elsevier Interactive Patient Education ©2016 Elsevier Inc.  Irritable Bowel Syndrome, Adult  Irritable bowel syndrome (IBS) is not one specific disease. It is a group of symptoms that affects the organs responsible for digestion (gastrointestinal   or GI tract).   To regulate how your GI tract works, your body sends signals back and forth between your intestines and your brain. If you have IBS, there may be a problem with these signals. As a result, your GI tract does not function normally. Your intestines may become more sensitive and overreact to certain things. This is especially true when you eat certain foods or when you are under stress.   There are four types of IBS. These may be determined based on the consistency of your stool:   · IBS with  diarrhea.    · IBS with constipation.    · Mixed IBS.    · Unsubtyped IBS.    It is important to know which type of IBS you have. Some treatments are more likely to be helpful for certain types of IBS.   CAUSES   The exact cause of IBS is not known.  RISK FACTORS  You may have a higher risk of IBS if:  · You are a woman.  · You are younger than 56 years old.  · You have a family history of IBS.  · You have mental health problems.  · You have had bacterial infection of your GI tract.  SIGNS AND SYMPTOMS   Symptoms of IBS vary from person to person. The main symptom is abdominal pain or discomfort. Additional symptoms usually include one or more of the following:   · Diarrhea, constipation, or both.    · Abdominal swelling or bloating.    · Feeling full or sick after eating a small or regular-size meal.    · Frequent gas.    · Mucus in the stool.    · A feeling of having more stool left after a bowel movement.    Symptoms tend to come and go. They may be associated with stress, psychiatric conditions, or nothing at all.   DIAGNOSIS   There is no specific test to diagnose IBS. Your health care provider will make a diagnosis based on a physical exam, medical history, and your symptoms. You may have other tests to rule out other conditions that may be causing your symptoms. These may include:   · Blood tests.    · X-rays.    · CT scan.  · Endoscopy and colonoscopy. This is a test in which your GI tract is viewed with a long, thin, flexible tube.  TREATMENT  There is no cure for IBS, but treatment can help relieve symptoms. IBS treatment often includes:   · Changes to your diet, such as:    Eating more fiber.    Avoiding foods that cause symptoms.    Drinking more water.    Eating regular, medium-sized portioned meals.  · Medicines. These may include:    Fiber supplements if you have constipation.    Medicine to control diarrhea (antidiarrheal medicines).    Medicine to help control muscle spasms in your GI tract  (antispasmodic medicines).    Medicines to help with any mental health issues, such as antidepressants or tranquilizers.  · Therapy.    Talk therapy may help with anxiety, depression, or other mental health issues that can make IBS symptoms worse.  · Stress reduction.    Managing your stress can help keep symptoms under control.  HOME CARE INSTRUCTIONS   · Take medicines only as directed by your health care provider.  · Eat a healthy diet.    Avoid foods and drinks with added sugar.    Include more whole grains, fruits, and vegetables gradually into your diet.   severe and worsening abdominal pain.   You have diarrhea and:   You have a rash, stiff neck, or severe headache.   You are irritable, sleepy, or difficult to awaken.   You are weak, dizzy, or extremely thirsty.   You have bright red blood in your stool or you have black tarry stools.   You have unusual abdominal swelling that is painful.   You vomit continuously.   You vomit blood (hematemesis).   You have both abdominal pain and a fever.    This information is not intended to replace advice given to you by your health care provider. Make sure you discuss any questions you have with your health care provider.   Document Released: 02/15/2005 Document Revised: 03/08/2014 Document  Reviewed: 11/02/2013 Elsevier Interactive Patient Education 2016 Elsevier Inc. Try bentyl Physical in 1 year Call in follow up of taking bentyl Get MAMMOGRAM Labs with PCP

## 2015-09-03 ENCOUNTER — Other Ambulatory Visit: Payer: Self-pay | Admitting: Adult Health

## 2015-09-04 ENCOUNTER — Other Ambulatory Visit: Payer: Self-pay | Admitting: Adult Health

## 2015-11-30 HISTORY — PX: NECK SURGERY: SHX720

## 2015-12-01 ENCOUNTER — Other Ambulatory Visit: Payer: Self-pay | Admitting: *Deleted

## 2015-12-01 MED ORDER — VALACYCLOVIR HCL 1 G PO TABS: ORAL_TABLET | ORAL | 3 refills | Status: DC

## 2015-12-26 DIAGNOSIS — M4802 Spinal stenosis, cervical region: Secondary | ICD-10-CM | POA: Insufficient documentation

## 2016-02-10 DIAGNOSIS — M542 Cervicalgia: Secondary | ICD-10-CM | POA: Insufficient documentation

## 2016-05-13 DIAGNOSIS — G894 Chronic pain syndrome: Secondary | ICD-10-CM | POA: Insufficient documentation

## 2016-05-17 DIAGNOSIS — M65311 Trigger thumb, right thumb: Secondary | ICD-10-CM | POA: Insufficient documentation

## 2016-06-04 ENCOUNTER — Ambulatory Visit (INDEPENDENT_AMBULATORY_CARE_PROVIDER_SITE_OTHER): Payer: Medicare Other | Admitting: Adult Health

## 2016-06-04 ENCOUNTER — Encounter: Payer: Self-pay | Admitting: Adult Health

## 2016-06-04 VITALS — BP 90/60 | HR 60 | Ht 64.0 in | Wt 132.0 lb

## 2016-06-04 DIAGNOSIS — Z01419 Encounter for gynecological examination (general) (routine) without abnormal findings: Secondary | ICD-10-CM

## 2016-06-04 DIAGNOSIS — Z1212 Encounter for screening for malignant neoplasm of rectum: Secondary | ICD-10-CM

## 2016-06-04 DIAGNOSIS — Z1211 Encounter for screening for malignant neoplasm of colon: Secondary | ICD-10-CM

## 2016-06-04 LAB — HEMOCCULT GUIAC POC 1CARD (OFFICE): Fecal Occult Blood, POC: NEGATIVE

## 2016-06-04 NOTE — Patient Instructions (Signed)
Physical in 1 year Mammogram now and yearly Labs with PCP

## 2016-06-04 NOTE — Progress Notes (Signed)
Patient ID: Jacqueline Phillips, female   DOB: 1961-01-10, 56 y.o.   MRN: [id number] History of Present Illness:  Jacqueline Phillips is a 56 year old white female in for a well woman gyn exam, she is sp hysterectomy. PCP is Dr Corwin Levins and she says she had labs recently, and cholesterol was elevated.  Current Medications, Allergies, Past Medical History, Past Surgical History, Family History and Social History were reviewed in [employer] [facility] record.     Review of Systems:  Patient denies any headaches, hearing loss, fatigue, blurred vision, shortness of breath, chest pain, abdominal pain, problems with bowel movements, urination, or intercourse(orgasm not as easily achieved). No joint pain or mood swings.     Physical Exam:BP 90/60 (BP Location: Left Arm, Patient Position: Sitting, Cuff Size: Normal)   Pulse 60   Ht  (1.626 m)   Wt 132 lb (59.9 kg)   BMI 22.66 kg/m  General:  Well developed, well nourished, no acute distress Skin:  Warm and dry Neck:  Midline trachea, normal thyroid, good ROM, no lymphadenopathy Lungs; Clear to auscultation bilaterally Breast:  No dominant palpable mass, retraction, or nipple discharge Cardiovascular: Regular rate and rhythm Abdomen:  Soft, non tender, no hepatosplenomegaly Pelvic:  External genitalia is normal in appearance, no lesions.  The vagina is normal in appearance. Urethra has no lesions or masses. The cervix and uterus are absent.  No adnexal masses or tenderness noted.Bladder is non tender, no masses felt. Rectal: Good sphincter tone, no polyps, or hemorrhoids felt.  Hemoccult negative. Extremities/musculoskeletal:  No swelling or varicosities noted, no clubbing or cyanosis Psych:  No mood changes, alert and cooperative,seems happy PHQ 2 score 0. Encouraged to increase foreplay and use lubricate, and told that some of meds could affect this.  Impression:  1. Well woman exam with routine gynecological exam   2. Screening for  colorectal cancer      Plan: Physical in 1 year Mammogram now and yearly(past due, last one 2013) Labs with PCP  Colonoscopy per GI

## 2016-07-27 ENCOUNTER — Encounter (HOSPITAL_COMMUNITY): Payer: Self-pay | Admitting: *Deleted

## 2016-07-27 ENCOUNTER — Emergency Department (HOSPITAL_COMMUNITY): Payer: Medicare Other

## 2016-07-27 ENCOUNTER — Emergency Department (HOSPITAL_COMMUNITY)
Admission: EM | Admit: 2016-07-27 | Discharge: 2016-07-27 | Disposition: A | Discharge status: Home / Self Care | Payer: Medicare Other | Attending: Emergency Medicine | Admitting: Emergency Medicine

## 2016-07-27 DIAGNOSIS — Z79899 Other long term (current) drug therapy: Secondary | ICD-10-CM | POA: Diagnosis not present

## 2016-07-27 DIAGNOSIS — Z7982 Long term (current) use of aspirin: Secondary | ICD-10-CM | POA: Diagnosis not present

## 2016-07-27 DIAGNOSIS — Y939 Activity, unspecified: Secondary | ICD-10-CM | POA: Insufficient documentation

## 2016-07-27 DIAGNOSIS — Y929 Unspecified place or not applicable: Secondary | ICD-10-CM | POA: Insufficient documentation

## 2016-07-27 DIAGNOSIS — W010XXA Fall on same level from slipping, tripping and stumbling without subsequent striking against object, initial encounter: Secondary | ICD-10-CM | POA: Insufficient documentation

## 2016-07-27 DIAGNOSIS — F17210 Nicotine dependence, cigarettes, uncomplicated: Secondary | ICD-10-CM | POA: Insufficient documentation

## 2016-07-27 DIAGNOSIS — S80211A Abrasion, right knee, initial encounter: Secondary | ICD-10-CM | POA: Diagnosis not present

## 2016-07-27 DIAGNOSIS — Y999 Unspecified external cause status: Secondary | ICD-10-CM | POA: Diagnosis not present

## 2016-07-27 DIAGNOSIS — M25561 Pain in right knee: Secondary | ICD-10-CM

## 2016-07-27 DIAGNOSIS — S8991XA Unspecified injury of right lower leg, initial encounter: Secondary | ICD-10-CM | POA: Diagnosis present

## 2016-07-27 NOTE — Discharge Instructions (Signed)
Clean the wound with mild soap and water only. Allow to "air dry" when possible.  Wear the brace as needed for support, but do not wear it to sleep.  Call Dr. Mort SawyersHarrison's office to arrange a follow-up appt.

## 2016-07-27 NOTE — ED Notes (Signed)
Pt alert & oriented x4, stable gait. Patient given discharge instructions, paperwork & prescription(s). Patient  instructed to stop at the registration desk to finish any additional paperwork. Patient verbalized understanding. Pt left department w/ no further questions. 

## 2016-07-27 NOTE — ED Triage Notes (Signed)
Pt tripped and fell on Friday and landed on her right knee.  Pt has a large abrasion that she has been treating with peroxide and neosporin.  Continues to have pain, today she had pain behind knee.  Pt ambulatory

## 2016-07-30 NOTE — ED Provider Notes (Signed)
[facility]-EMERG[facility]Y DEPT Provider Note   CSN: [id number] Arrival date & time: 07/27/16  1958     History   Chief Complaint Chief Complaint  Patient presents with  . Knee Pain    HPI LANDA MULLINAX is a 56 y.o. female.  HPI  ELY SPRAGG is a 56 y.o. female who presents to the Emergency Department complaining of right knee pain secondary to a mechanical fall that occurred 4 days ago.  States that she fell onto concrete and also suffered an abrasion to her knee that she has been cleaning daily with peroxide and applying neosporin.  She describes aching pain to her knee that is worse with weight bearing and bending.  She denies numbness, swelling, redness and drainage of the knee.   Past Medical History:  Diagnosis Date  . Anxiety   . Avascular necrosis (HCC)   . Counseling for estrogen replacement therapy 05/02/2013  . Fibromyalgia   . Hot flashes, menopausal 05/02/2013  . HSV (herpes simplex virus) infection   . IBS (irritable bowel syndrome) 06/02/2015  . Neuromuscular disorder (HCC)    neuropathy  . Nicotine addiction 05/02/2013    Patient Active Problem List   Diagnosis Date Noted  . IBS (irritable bowel syndrome) 06/02/2015  . Hot flashes, menopausal 05/02/2013  . Nicotine addiction 05/02/2013  . Counseling for estrogen replacement therapy 05/02/2013  . HSV (herpes simplex virus) infection 01/16/2013  . Libido, decreased 01/16/2013    Past Surgical History:  Procedure Laterality Date  . ABDOMINAL HYSTERECTOMY    . CARPAL TUNNEL RELEASE    . CESAREAN SECTION    . HIP SURGERY Left 03/31/15  . KNEE SURGERY    . NECK SURGERY    . NECK SURGERY  11/2015  . SPINAL FUSION    . TUBAL LIGATION      OB History    Gravida Para Term Preterm AB Living   2 2 2     2    SAB TAB Ectopic Multiple Live Births           2       Home Medications    Prior to Admission medications   Medication Sig Start Date End Date Taking? Authorizing Provider  ALPRAZolam Prudy Feeler) 1 MG  tablet Take 1 mg by mouth 3 (three) times daily.     [provider]  aspirin 81 MG tablet Take 81 mg by mouth daily.    [provider]  gabapentin (NEURONTIN) 800 MG tablet Take 800 mg by mouth 3 (three) times daily.     [provider]  ketoprofen (ORUDIS) 75 MG capsule Take 75 mg by mouth as needed. migrains    [provider]  oxyCODONE (ROXICODONE) 15 MG immediate release tablet Take 15 mg by mouth every 6 (six) hours as needed.     [provider]  promethazine (PHENERGAN) 25 MG tablet Take 25 mg by mouth every 6 (six) hours as needed. Nausea/vomiting     [provider]  RESTASIS 0.05 % ophthalmic emulsion Place 1 drop into both eyes daily.  11/28/12   [provider]  valACYclovir (VALTREX) 1000 MG tablet TAKE 1 TABLET (1,000 MG TOTAL) BY MOUTH DAILY. 12/01/15   Adline Potter, NP    Family History Family History  Problem Relation Age of Onset  . Arthritis Mother   . Cancer Mother   . Cancer Father        lung  . Cancer Maternal Aunt   . Cancer  Maternal Uncle        brain  . Cancer Paternal Uncle        colon  . Heart disease Paternal Uncle   . Heart disease Paternal Uncle   . Stroke Maternal Grandmother   . Cancer Paternal Grandmother     Social History Social History  Substance Use Topics  . Smoking status: Current Some Day Smoker    Packs/day: 0.50    Years: 40.00    Types: Cigarettes  . Smokeless tobacco: Never Used  . Alcohol use No     Allergies   Zolmitriptan; Oxycontin [oxycodone hcl]; Fentanyl; Ibuprofen; Lyrica [pregabalin]; and Prednisone   Review of Systems Review of Systems  Constitutional: Negative for chills and fever.  Genitourinary: Negative for difficulty urinating and dysuria.  Musculoskeletal: Positive for arthralgias (right knee pain). Negative for joint swelling.  Skin: Negative for color change and wound (abrasion knee).  All other systems reviewed and are  negative.    Physical Exam Updated Vital Signs BP (!) 130/42   Pulse 66   Temp 98.1 F (36.7 C) (Oral)   Resp 20   Wt 59.9 kg (132 lb)   SpO2 96%   BMI 22.66 kg/m   Physical Exam  Constitutional: She is oriented to person, place, and time. She appears well-developed and well-nourished. No distress.  Cardiovascular: Normal rate, regular rhythm, normal heart sounds and intact distal pulses.   Pulmonary/Chest: Effort normal and breath sounds normal.  Musculoskeletal: She exhibits tenderness. She exhibits no edema.  ttp of the anterior and posterior right  knee.  No erythema, effusion, or step-off deformity.  Large abrasion w/o drainage.  Calf is soft and NT.  Neurological: She is alert and oriented to person, place, and time. She exhibits normal muscle tone. Coordination normal.  Skin: Skin is warm and dry. Capillary refill takes less than 2 seconds. No erythema.  Nursing note and vitals reviewed.    ED Treatments / Results  Labs (all labs ordered are listed, but only abnormal results are displayed) Labs Reviewed - No data to display  EKG  EKG Interpretation None       Radiology Dg Knee Complete 4 Views Right  Result Date: 07/27/2016 CLINICAL DATA:  56 year old female with fall and trauma the right knee. EXAM: RIGHT KNEE - COMPLETE 4+ VIEW COMPARISON:  None. FINDINGS: There is no acute fracture or dislocation. The bones are well mineralized. No arthritic changes. Trace suprapatellar effusion noted. The soft tissues appear unremarkable. IMPRESSION: No acute/ traumatic osseous pathology. Electronically Signed   By: Elgie Collard M.D.   On: 07/27/2016 21:06     Procedures Procedures (including critical care time)  Medications Ordered in ED Medications - No data to display   Initial Impression / Assessment and Plan / ED Course  I have reviewed the triage vital signs and the nursing notes.  Pertinent labs & imaging results that were available during my care of the  patient were reviewed by me and considered in my medical decision making (see chart for details).     XR neg for fx.  Counseled pt to avoid continued use of peroxide.  Knee braced with a knee sleeve.  Abrasion dressed with xeroform. No concerning sx's for infection.  Pt given referral to orthopedics.  Final Clinical Impressions(s) / ED Diagnoses   Final diagnoses:  Acute pain of right knee  Abrasion of right knee, initial encounter    New Prescriptions Discharge Medication List as of 07/27/2016 10:53 PM  Pauline Ausriplett, Artemisia Auvil, PA-C 07/30/16 1334    Loren RacerYelverton, David, MD 08/05/16 1428

## 2016-11-19 DIAGNOSIS — Z79899 Other long term (current) drug therapy: Secondary | ICD-10-CM | POA: Insufficient documentation

## 2016-12-07 ENCOUNTER — Other Ambulatory Visit (HOSPITAL_COMMUNITY): Payer: Self-pay | Admitting: Internal Medicine

## 2016-12-07 DIAGNOSIS — Z1231 Encounter for screening mammogram for malignant neoplasm of breast: Secondary | ICD-10-CM

## 2016-12-09 ENCOUNTER — Other Ambulatory Visit: Payer: Self-pay | Admitting: Adult Health

## 2016-12-20 ENCOUNTER — Ambulatory Visit (HOSPITAL_COMMUNITY)
Admission: RE | Admit: 2016-12-20 | Discharge: 2016-12-20 | Disposition: A | Discharge status: Home / Self Care | Payer: Medicare Other | Source: Ambulatory Visit | Attending: Internal Medicine | Admitting: Internal Medicine

## 2016-12-20 ENCOUNTER — Encounter (HOSPITAL_COMMUNITY): Payer: Self-pay

## 2016-12-20 DIAGNOSIS — Z1231 Encounter for screening mammogram for malignant neoplasm of breast: Secondary | ICD-10-CM

## 2017-06-06 ENCOUNTER — Encounter: Payer: Self-pay | Admitting: Adult Health

## 2017-06-06 ENCOUNTER — Ambulatory Visit (INDEPENDENT_AMBULATORY_CARE_PROVIDER_SITE_OTHER): Payer: Medicare Other | Admitting: Adult Health

## 2017-06-06 ENCOUNTER — Other Ambulatory Visit: Payer: Self-pay

## 2017-06-06 VITALS — BP 118/70 | HR 67 | Ht 64.0 in | Wt 141.0 lb

## 2017-06-06 DIAGNOSIS — M25512 Pain in left shoulder: Secondary | ICD-10-CM

## 2017-06-06 DIAGNOSIS — Z01411 Encounter for gynecological examination (general) (routine) with abnormal findings: Secondary | ICD-10-CM | POA: Diagnosis not present

## 2017-06-06 DIAGNOSIS — Z1211 Encounter for screening for malignant neoplasm of colon: Secondary | ICD-10-CM | POA: Diagnosis not present

## 2017-06-06 DIAGNOSIS — Z1212 Encounter for screening for malignant neoplasm of rectum: Secondary | ICD-10-CM

## 2017-06-06 DIAGNOSIS — Z01419 Encounter for gynecological examination (general) (routine) without abnormal findings: Secondary | ICD-10-CM | POA: Insufficient documentation

## 2017-06-06 LAB — HEMOCCULT GUIAC POC 1CARD (OFFICE): FECAL OCCULT BLD: NEGATIVE

## 2017-06-06 NOTE — Progress Notes (Signed)
Patient ID: Jacqueline Phillips, female   DOB: 1960-04-07, 57 y.o.   MRN: [id number] History of Present Illness: Jacqueline Phillips is a 57 year old white female sp hysterectomy in for a well woman gyn exam. PCP is Dr Jacqueline Phillips.    Current Medications, Allergies, Past Medical History, Past Surgical History, Family History and Social History were reviewed in [name] electronic medical record.     Review of Systems: Patient denies any headaches, hearing loss, fatigue, blurred vision, shortness of breath, chest pain, abdominal pain, problems with bowel movements, urination, or intercourse(not active). No joint pain or mood swings. Pain left shoulder/neck area, may have slept wrong.   Physical Exam:BP 118/70 (BP Location: Left Arm, Patient Position: Sitting, Cuff Size: Normal)   Pulse 67   Ht 5\' 4"  (1.626 m)   Wt 141 lb (64 kg)   BMI 24.20 kg/m  General:  Well developed, well nourished, no acute distress Skin:  Warm and dry Neck:  Midline trachea, normal thyroid, good ROM, no lymphadenopathy Lungs; Clear to auscultation bilaterally Breast:  No dominant palpable mass, retraction, or nipple discharge Cardiovascular: Regular rate and rhythm Abdomen:  Soft, non tender, no hepatosplenomegaly Pelvic:  External genitalia is normal in appearance, no lesions.  The vagina is normal in appearance. Urethra has no lesions or masses. The cervix and uterus are absent. No adnexal masses or tenderness noted.Bladder is non tender, no masses felt. Rectal: Good sphincter tone, no polyps, or hemorrhoids felt.  Hemoccult negative. Extremities/musculoskeletal:  No swelling or varicosities noted, no clubbing or cyanosis Psych:  No mood changes, alert and cooperative,seems happy PHQ 2 score 0.  Impression: 1. Encounter for well woman exam with routine gynecological exam   2. Screening for colorectal cancer       Plan: Physical in 2 years Mammogram yearly Labs with PCP Colonoscopy per GI Talk with pain  doctor about lidocaine patch or medical massage Try alternating ice and heat to should/neck area

## 2017-07-17 ENCOUNTER — Emergency Department (HOSPITAL_COMMUNITY): Payer: No Typology Code available for payment source

## 2017-07-17 ENCOUNTER — Emergency Department (HOSPITAL_COMMUNITY)
Admission: EM | Admit: 2017-07-17 | Discharge: 2017-07-17 | Disposition: A | Discharge status: Home / Self Care | Payer: No Typology Code available for payment source | Attending: Emergency Medicine | Admitting: Emergency Medicine

## 2017-07-17 ENCOUNTER — Encounter (HOSPITAL_COMMUNITY): Payer: Self-pay | Admitting: Emergency Medicine

## 2017-07-17 ENCOUNTER — Other Ambulatory Visit: Payer: Self-pay

## 2017-07-17 DIAGNOSIS — S299XXA Unspecified injury of thorax, initial encounter: Secondary | ICD-10-CM | POA: Diagnosis present

## 2017-07-17 DIAGNOSIS — Y92410 Unspecified street and highway as the place of occurrence of the external cause: Secondary | ICD-10-CM | POA: Insufficient documentation

## 2017-07-17 DIAGNOSIS — Y998 Other external cause status: Secondary | ICD-10-CM | POA: Diagnosis not present

## 2017-07-17 DIAGNOSIS — Y9389 Activity, other specified: Secondary | ICD-10-CM | POA: Diagnosis not present

## 2017-07-17 DIAGNOSIS — F17210 Nicotine dependence, cigarettes, uncomplicated: Secondary | ICD-10-CM | POA: Diagnosis not present

## 2017-07-17 DIAGNOSIS — S20212A Contusion of left front wall of thorax, initial encounter: Secondary | ICD-10-CM | POA: Diagnosis not present

## 2017-07-17 DIAGNOSIS — Z79899 Other long term (current) drug therapy: Secondary | ICD-10-CM | POA: Insufficient documentation
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
## 2017-07-17 NOTE — Discharge Instructions (Addendum)
Xrays and EKG were normal.  You will be sore for several days.  Tylenol or Ibuprofen for pain
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
## 2017-07-17 NOTE — ED Notes (Signed)
Pure wick in place 

## 2017-08-11 ENCOUNTER — Telehealth: Payer: Self-pay | Admitting: Adult Health

## 2017-08-11 MED ORDER — VALACYCLOVIR HCL 1 G PO TABS: ORAL_TABLET | ORAL | 2 refills | Status: DC

## 2017-08-11 NOTE — Telephone Encounter (Signed)
Pt is requesting refill on Val[facility] [place]. [facility] pt [facility] and she could check with her pharmacy later today. Pt verbalized understanding.

## 2017-08-11 NOTE — Telephone Encounter (Signed)
Refilled valtrex  

## 2017-08-11 NOTE — Telephone Encounter (Signed)
Patient called stating that she would like a refill of her medication, [place] [facility] [place] Please contact pt

## 2017-08-23 ENCOUNTER — Other Ambulatory Visit: Payer: Self-pay | Admitting: Adult Health

## 2017-11-10 ENCOUNTER — Other Ambulatory Visit (HOSPITAL_COMMUNITY): Payer: Self-pay | Admitting: Internal Medicine

## 2017-11-10 DIAGNOSIS — Z1231 Encounter for screening mammogram for malignant neoplasm of breast: Secondary | ICD-10-CM

## 2017-12-22 ENCOUNTER — Ambulatory Visit (HOSPITAL_COMMUNITY)
Admission: RE | Admit: 2017-12-22 | Discharge: 2017-12-22 | Disposition: A | Discharge status: Home / Self Care | Payer: Medicare Other | Source: Ambulatory Visit | Attending: Internal Medicine | Admitting: Internal Medicine

## 2017-12-22 DIAGNOSIS — Z1231 Encounter for screening mammogram for malignant neoplasm of breast: Secondary | ICD-10-CM

## 2018-03-10 ENCOUNTER — Telehealth: Payer: Self-pay | Admitting: *Deleted

## 2018-03-10 MED ORDER — VALACYCLOVIR HCL 1 G PO TABS: ORAL_TABLET | ORAL | 2 refills | Status: DC

## 2018-03-10 NOTE — Telephone Encounter (Signed)
Pt requests refill on valcyclovir. Advised that I would send her request to a provider and she could check with her pharmacy later today.  Pt verbalized understanding.

## 2018-03-10 NOTE — Telephone Encounter (Signed)
Refilled valtrex  

## 2018-11-16 ENCOUNTER — Other Ambulatory Visit (HOSPITAL_COMMUNITY): Payer: Self-pay | Admitting: Internal Medicine

## 2018-11-16 DIAGNOSIS — Z1231 Encounter for screening mammogram for malignant neoplasm of breast: Secondary | ICD-10-CM

## 2018-12-27 ENCOUNTER — Ambulatory Visit (HOSPITAL_COMMUNITY): Payer: Medicare Other

## 2019-03-14 ENCOUNTER — Other Ambulatory Visit: Payer: Self-pay | Admitting: *Deleted

## 2019-03-14 MED ORDER — VALACYCLOVIR HCL 1 G PO TABS: ORAL_TABLET | ORAL | 2 refills | Status: DC

## 2019-03-15 ENCOUNTER — Ambulatory Visit (HOSPITAL_COMMUNITY)
Admission: RE | Admit: 2019-03-15 | Discharge: 2019-03-15 | Disposition: A | Discharge status: Home / Self Care | Payer: Medicare Other | Source: Ambulatory Visit | Attending: Internal Medicine | Admitting: Internal Medicine

## 2019-03-15 ENCOUNTER — Other Ambulatory Visit: Payer: Self-pay

## 2019-03-15 DIAGNOSIS — Z1231 Encounter for screening mammogram for malignant neoplasm of breast: Secondary | ICD-10-CM | POA: Insufficient documentation

## 2019-05-17 DIAGNOSIS — M961 Postlaminectomy syndrome, not elsewhere classified: Secondary | ICD-10-CM | POA: Insufficient documentation

## 2019-05-17 DIAGNOSIS — M47816 Spondylosis without myelopathy or radiculopathy, lumbar region: Secondary | ICD-10-CM | POA: Insufficient documentation

## 2019-09-27 DIAGNOSIS — J449 Chronic obstructive pulmonary disease, unspecified: Secondary | ICD-10-CM | POA: Insufficient documentation

## 2019-10-31 ENCOUNTER — Encounter: Payer: Self-pay | Admitting: Adult Health

## 2019-10-31 ENCOUNTER — Ambulatory Visit (INDEPENDENT_AMBULATORY_CARE_PROVIDER_SITE_OTHER): Payer: Medicare HMO | Admitting: Adult Health

## 2019-10-31 VITALS — BP 116/64 | HR 65 | Ht 64.0 in | Wt 139.0 lb

## 2019-10-31 DIAGNOSIS — Z8619 Personal history of other infectious and parasitic diseases: Secondary | ICD-10-CM | POA: Diagnosis not present

## 2019-10-31 MED ORDER — VALACYCLOVIR HCL 1 G PO TABS: ORAL_TABLET | ORAL | 3 refills | Status: DC

## 2019-10-31 NOTE — Progress Notes (Signed)
  Subjective:     Patient ID: Jacqueline Phillips, female   DOB: 1960/07/29, 59 y.o.   MRN: [id number]  HPI [name] is a 59 year old white female,sp hysterectomy in complaining of herpes flare, last week, better now.Had missed several valtrex and has stress at home. PCP is Dr Katrinka Blazing.  Review of Systems Had herpes outbreak last week, better now Has had stress at home   Reviewed past medical,surgical, social and family history. Reviewed medications and allergies.     Objective:   Physical Exam BP 116/64 (BP Location: Left Arm, Patient Position: Sitting, Cuff Size: Normal)   Pulse 65   Ht 5\' 4"  (1.626 m)   Wt 139 lb (63 kg)   BMI 23.86 kg/m    Skin warm and dry.Pelvic: external genitalia is normal in appearance no lesions, vagina: pale pink with loss of moisture and rugae,urethra has no lesions or masses noted, cervix and uterus are absent,  adnexa: no masses or tenderness noted. Bladder is non tender and no masses felt. Pt gave verbal permission of exam with out chaperone.  Fall risk is low  [facility] - 10/31/19 1139      Pregnancy Intention Screening   Does the patient want to become pregnant in the next year? N/A    Does the patient's partner want to become pregnant in the next year? N/A    Would the patient like to discuss contraceptive options today? N/A      Contraception Wrap Up   Current Method No Method - Other Reason   hyst   End Method No Method - Other Reason   hyst   Contraception Counseling Provided No          Assessment:     1. History of herpes simplex infection Had flare last week, resolved now Will refill valtrex to take 1 daily for suppression  Meds ordered this encounter  Medications  . valACYclovir (VALTREX) 1000 MG tablet    Sig: TAKE 1 TABLET (1,000 MG TOTAL) BY MOUTH DAILY.    Dispense:  90 tablet    Refill:  3    Order Specific Question:   Supervising Provider    Answer:   12/31/19 [[id number]]      Plan:     Follow up in 1 year for physical   or sooner if needed

## 2020-02-08 ENCOUNTER — Other Ambulatory Visit (HOSPITAL_COMMUNITY): Payer: Self-pay | Admitting: Internal Medicine

## 2020-02-08 DIAGNOSIS — Z1231 Encounter for screening mammogram for malignant neoplasm of breast: Secondary | ICD-10-CM

## 2020-03-14 ENCOUNTER — Ambulatory Visit (HOSPITAL_COMMUNITY): Payer: 59

## 2020-03-17 ENCOUNTER — Ambulatory Visit (HOSPITAL_COMMUNITY): Payer: Medicare HMO

## 2020-04-10 ENCOUNTER — Ambulatory Visit (HOSPITAL_COMMUNITY)
Admission: RE | Admit: 2020-04-10 | Discharge: 2020-04-10 | Disposition: A | Discharge status: Home / Self Care | Payer: 59 | Source: Ambulatory Visit | Attending: Internal Medicine | Admitting: Internal Medicine

## 2020-04-10 ENCOUNTER — Other Ambulatory Visit: Payer: Self-pay

## 2020-04-10 DIAGNOSIS — Z1231 Encounter for screening mammogram for malignant neoplasm of breast: Secondary | ICD-10-CM | POA: Diagnosis present

## 2020-10-06 ENCOUNTER — Other Ambulatory Visit: Payer: Self-pay

## 2020-10-06 ENCOUNTER — Emergency Department (HOSPITAL_COMMUNITY): Payer: Medicare Other

## 2020-10-06 ENCOUNTER — Emergency Department (HOSPITAL_COMMUNITY)
Admission: EM | Admit: 2020-10-06 | Discharge: 2020-10-06 | Disposition: A | Discharge status: Home / Self Care | Payer: Medicare Other | Attending: Emergency Medicine | Admitting: Emergency Medicine

## 2020-10-06 ENCOUNTER — Encounter (HOSPITAL_COMMUNITY): Payer: Self-pay | Admitting: *Deleted

## 2020-10-06 DIAGNOSIS — S99912A Unspecified injury of left ankle, initial encounter: Secondary | ICD-10-CM | POA: Diagnosis present

## 2020-10-06 DIAGNOSIS — W07XXXA Fall from chair, initial encounter: Secondary | ICD-10-CM | POA: Insufficient documentation

## 2020-10-06 DIAGNOSIS — F17210 Nicotine dependence, cigarettes, uncomplicated: Secondary | ICD-10-CM | POA: Insufficient documentation

## 2020-10-06 DIAGNOSIS — Y92513 Shop (commercial) as the place of occurrence of the external cause: Secondary | ICD-10-CM | POA: Diagnosis not present

## 2020-10-06 DIAGNOSIS — S92002A Unspecified fracture of left calcaneus, initial encounter for closed fracture: Secondary | ICD-10-CM | POA: Diagnosis not present

## 2020-10-06 DIAGNOSIS — S82892A Other fracture of left lower leg, initial encounter for closed fracture: Secondary | ICD-10-CM

## 2020-10-06 DIAGNOSIS — M25572 Pain in left ankle and joints of left foot: Secondary | ICD-10-CM | POA: Diagnosis not present

## 2020-10-06 DIAGNOSIS — S93402A Sprain of unspecified ligament of left ankle, initial encounter: Secondary | ICD-10-CM

## 2020-10-06 NOTE — ED Provider Notes (Signed)
[facility] EMERGENCY DEPARTMENT Provider Note   CSN: [id number] Arrival date & time: 10/06/20  1259     History Chief Complaint  Patient presents with   Ankle Pain    Jacqueline Phillips is a 60 y.o. female.  Jacqueline Phillips is a 60yo female with a PMH of fibromyalgia who presents to the ED complaining of left ankle pain following a fall this morning. She states she was at a car repair shop and fell while getting up from her chair and twisted her left ankle. She complains of immediate pain and was put into a splint by EMS, and later transported here by her brother. She notes that her ankle is swollen, and feels pain radiating down the lateral side of her left foot. She states she took a 5mg  oxycodone earlier for pain.       Past Medical History:  Diagnosis Date   Anxiety    Avascular necrosis (HCC)    Counseling for estrogen replacement therapy 05/02/2013   Fibromyalgia    Hot flashes, menopausal 05/02/2013   HSV (herpes simplex virus) infection    IBS (irritable bowel syndrome) 06/02/2015   Neuromuscular disorder (HCC)    neuropathy   Nicotine addiction 05/02/2013    Patient Active Problem List   Diagnosis Date Noted   History of herpes simplex infection 10/31/2019   Encounter for well woman exam with routine gynecological exam 06/06/2017   Screening for colorectal cancer 06/06/2017   IBS (irritable bowel syndrome) 06/02/2015   Hot flashes, menopausal 05/02/2013   Nicotine addiction 05/02/2013   Counseling for estrogen replacement therapy 05/02/2013   HSV (herpes simplex virus) infection 01/16/2013   Libido, decreased 01/16/2013    Past Surgical History:  Procedure Laterality Date   ABDOMINAL HYSTERECTOMY     CARPAL TUNNEL RELEASE     CESAREAN SECTION     HIP SURGERY Left 03/31/15   KNEE SURGERY     NECK SURGERY     NECK SURGERY  11/2015   SPINAL FUSION     TUBAL LIGATION       OB History     Gravida  2   Para  2   Term  2   Preterm      AB      Living  2       SAB      IAB      Ectopic      Multiple      Live Births  2           Family History  Problem Relation Age of Onset   Arthritis Mother    Cancer Mother    Cancer Father        lung   Cancer Maternal Aunt    Cancer Maternal Uncle        brain   Cancer Paternal Uncle        colon   Heart disease Paternal Uncle    Heart disease Paternal Uncle    Stroke Maternal Grandmother    Cancer Paternal Grandmother     Social History   Tobacco Use   Smoking status: Every Day    Packs/day: 1.00    Years: 40.00    Pack years: 40.00    Types: Cigarettes   Smokeless tobacco: Never  Vaping Use   Vaping Use: Never used  Substance Use Topics   Alcohol use: No   Drug use: No    Home Medications Prior to Admission medications  Medication Sig Start Date End Date Taking? Authorizing Provider  ALPRAZolam Prudy Feeler) 1 MG tablet Take 1 mg by mouth 3 (three) times daily as needed for anxiety.     [provider]  aspirin 81 MG tablet Take 81 mg by mouth daily as needed for pain.     [provider]  gabapentin (NEURONTIN) 800 MG tablet Take 800 mg by mouth 3 (three) times daily.     [provider]  Multiple Vitamin (MULTIVITAMIN) tablet Take 1 tablet by mouth daily.    [provider]  oxyCODONE (ROXICODONE) 15 MG immediate release tablet Take 15 mg by mouth every 6 (six) hours as needed.     [provider]  pantoprazole (PROTONIX) 40 MG tablet Take 40 mg by mouth daily. 07/11/17   [provider]  promethazine (PHENERGAN) 25 MG tablet Take 25 mg by mouth every 6 (six) hours as needed. Nausea/vomiting     [provider]  RESTASIS 0.05 % ophthalmic emulsion Place 1 drop into both eyes daily.  11/28/12   [provider]  valACYclovir (VALTREX) 1000 MG tablet TAKE 1 TABLET (1,000 MG TOTAL) BY MOUTH DAILY. 10/31/19   Adline Potter, NP    Allergies    Zolmitriptan, Methocarbamol, Oxycontin [oxycodone hcl],  Prednisone, Fentanyl, and Lyrica [pregabalin]  Review of Systems   Review of Systems  Constitutional:  Negative for fever.  Musculoskeletal:  Positive for arthralgias, gait problem and joint swelling.  Skin:  Negative for color change, rash and wound.  Allergic/Immunologic: Negative for immunocompromised state.  Neurological:  Negative for weakness and numbness.  Hematological:  Does not bruise/bleed easily.  Psychiatric/Behavioral:  Negative for confusion.   All other systems reviewed and are negative.  Physical Exam Updated Vital Signs BP 124/67 (BP Location: Right Arm)   Pulse 71   Temp 98.2 F (36.8 C) (Oral)   Resp 18   Ht 5\' 5"  (1.651 m)   Wt 61.2 kg   SpO2 95%   BMI 22.47 kg/m   Physical Exam Vitals and nursing note reviewed.  Constitutional:      General: She is not in acute distress.    Appearance: She is well-developed. She is not diaphoretic.  HENT:     Head: Normocephalic and atraumatic.  Cardiovascular:     Pulses: Normal pulses.  Pulmonary:     Effort: Pulmonary effort is normal.  Musculoskeletal:     Left ankle: Tenderness present over the lateral malleolus. No base of 5th metatarsal or proximal fibula tenderness. Decreased range of motion.  Skin:    General: Skin is warm and dry.     Findings: No erythema or rash.  Neurological:     Mental Status: She is alert and oriented to person, place, and time.     Sensory: No sensory deficit.     Motor: No weakness.  Psychiatric:        Behavior: Behavior normal.    ED Results / Procedures / Treatments   Labs (all labs ordered are listed, but only abnormal results are displayed) Labs Reviewed - No data to display  EKG None  Radiology DG Ankle Complete Left  Result Date: 10/06/2020 CLINICAL DATA:  Fall with twisting injury.  Ankle pain. EXAM: LEFT ANKLE COMPLETE - 3+ VIEW COMPARISON:  None. FINDINGS: The bones appear mildly demineralized. There is no evidence of acute fracture or dislocation at the  ankle. There is a possible small avulsion fracture from the lateral aspect of the calcaneus, best seen  on the AP view. The ankle joint spaces are preserved. No focal soft tissue swelling or foreign body identified. IMPRESSION: Possible small avulsion fracture laterally from the calcaneus. No evidence of acute fracture or dislocation at the ankle. Electronically Signed   By: Carey Bullocks M.D.   On: 10/06/2020 15:26    Procedures Procedures   Medications Ordered in ED Medications - No data to display  ED Course  I have reviewed the triage vital signs and the nursing notes.  Pertinent labs & imaging results that were available during my care of the patient were reviewed by me and considered in my medical decision making (see chart for details).  Clinical Course as of 10/06/20 1704  Mon Oct 06, 2020  5413 60 year old female with left ankle injury as above.  Found to have tenderness inferior and slightly posterior to the lateral malleolus.  DP pulse present, sensation intact. No pain at proximal fibula or fifth metatarsal. X-ray concerning for possible avulsion off of the calcaneus and area of tenderness.  Plan is to place patient in a cam walker boot.  She has a walker at home, advised to weight-bear as tolerated and follow-up with orthopedics. [LM]    Clinical Course User Index [LM] Alden Hipp   MDM Rules/Calculators/A&P                           Final Clinical Impression(s) / ED Diagnoses Final diagnoses:  Sprain of left ankle, unspecified ligament, initial encounter  Closed avulsion fracture of left ankle, initial encounter    Rx / DC Orders ED Discharge Orders     None        Jeannie Fend, PA-C 10/06/20 1704    Linwood Dibbles, MD 10/07/20 [phone]

## 2020-10-06 NOTE — ED Triage Notes (Signed)
Pt states she went to get up from sitting and her left ankle twisted the wrong way causing her to fall; ems arrived on the scene and placed splint

## 2020-10-06 NOTE — Discharge Instructions (Addendum)
Wear boot.  Use your walker when walking.  Follow-up with orthopedics, call to schedule an appointment.

## 2020-10-09 ENCOUNTER — Encounter: Payer: Self-pay | Admitting: Orthopedic Surgery

## 2020-10-09 ENCOUNTER — Other Ambulatory Visit: Payer: Self-pay

## 2020-10-09 ENCOUNTER — Ambulatory Visit (INDEPENDENT_AMBULATORY_CARE_PROVIDER_SITE_OTHER): Payer: Medicare Other | Admitting: Orthopedic Surgery

## 2020-10-09 VITALS — BP 108/51 | HR 66 | Ht 64.0 in | Wt 135.0 lb

## 2020-10-09 DIAGNOSIS — S92002A Unspecified fracture of left calcaneus, initial encounter for closed fracture: Secondary | ICD-10-CM

## 2020-10-09 NOTE — Progress Notes (Signed)
NEW PROBLEM//OFFICE VISIT  Summary assessment and plan:   Encounter Diagnosis  Name Primary?   Closed nondisplaced fracture of left calcaneus, unspecified portion of calcaneus, initial encounter Yes    The patient is comfortable in a cam walker and she will continue with that for 4 weeks until we see her for checkup no x-ray needed  Chief Complaint  Patient presents with   Ankle Injury    Pt states she stood up from a seated position stepped off with right foot with no problems but buckled immediately when she put pressure on the left. DOI 10/06/20   60 year old female low-energy trauma left foot x-ray shows avulsion from the calcaneus with soft tissue swelling  We note she is walking in the cam walker now      MEDICAL DECISION MAKING  A.  Encounter Diagnosis  Name Primary?   Closed nondisplaced fracture of left calcaneus, unspecified portion of calcaneus, initial encounter Yes    B. DATA ANALYSED:   IMAGING: Interpretation of images: External images show the lateral avulsion fracture  Orders: None  Outside records reviewed: None   C. MANAGEMENT   Nonoperative  No orders of the defined types were placed in this encounter.    BP (!) 108/51   Pulse 66   Ht 5\' 4"  (1.626 m)   Wt 135 lb (61.2 kg)   BMI 23.17 kg/m    General appearance: Well-developed well-nourished no gross deformities  Cardiovascular normal pulse and perfusion normal color without edema  Neurologically no sensation loss or deficits or pathologic reflexes  Psychological: Awake alert and oriented x3 mood and affect normal  Skin no lacerations or ulcerations no nodularity no palpable masses, no erythema or nodularity  Musculoskeletal: Tenderness and swelling over the lateral portion of the foot with normal alignment ankle motion is intact   ROS No evidence of numbness tingling or skin irritation from this injury  Past Medical History:  Diagnosis Date   Anxiety    Avascular necrosis  (HCC)    Counseling for estrogen replacement therapy 05/02/2013   Fibromyalgia    Hot flashes, menopausal 05/02/2013   HSV (herpes simplex virus) infection    IBS (irritable bowel syndrome) 06/02/2015   Neuromuscular disorder (HCC)    neuropathy   Nicotine addiction 05/02/2013    Past Surgical History:  Procedure Laterality Date   ABDOMINAL HYSTERECTOMY     CARPAL TUNNEL RELEASE     CESAREAN SECTION     HIP SURGERY Left 03/31/15   KNEE SURGERY     NECK SURGERY     NECK SURGERY  11/2015   SPINAL FUSION     TUBAL LIGATION      Family History  Problem Relation Age of Onset   Arthritis Mother    Cancer Mother    Cancer Father        lung   Cancer Maternal Aunt    Cancer Maternal Uncle        brain   Cancer Paternal Uncle        colon   Heart disease Paternal Uncle    Heart disease Paternal Uncle    Stroke Maternal Grandmother    Cancer Paternal Grandmother    Social History   Tobacco Use   Smoking status: Every Day    Packs/day: 1.00    Years: 40.00    Pack years: 40.00    Types: Cigarettes   Smokeless tobacco: Never  Vaping Use   Vaping Use: Never used  Substance Use Topics  Alcohol use: No   Drug use: No    Allergies  Allergen Reactions   Zolmitriptan Swelling    Zomig-Throat to swell    Methocarbamol Other (See Comments)    redness   Oxycontin [Oxycodone Hcl] Other (See Comments)    Difficulty voiding, bad nightmares   Prednisone Other (See Comments)    Breaks inside of mouth out   Fentanyl     Blood pressure drops   Lyrica [Pregabalin] Other (See Comments)    Blood pressure drop    Current Meds  Medication Sig   ALPRAZolam (XANAX) 1 MG tablet Take 1 mg by mouth 3 (three) times daily as needed for anxiety.    aspirin 81 MG tablet Take 81 mg by mouth daily as needed for pain.    gabapentin (NEURONTIN) 800 MG tablet Take 800 mg by mouth 3 (three) times daily.    Multiple Vitamin (MULTIVITAMIN) tablet Take 1 tablet by mouth daily.   oxyCODONE  (ROXICODONE) 15 MG immediate release tablet Take 15 mg by mouth every 6 (six) hours as needed.    pantoprazole (PROTONIX) 40 MG tablet Take 40 mg by mouth daily.   promethazine (PHENERGAN) 25 MG tablet Take 25 mg by mouth every 6 (six) hours as needed. Nausea/vomiting    RESTASIS 0.05 % ophthalmic emulsion Place 1 drop into both eyes daily.    valACYclovir (VALTREX) 1000 MG tablet TAKE 1 TABLET (1,000 MG TOTAL) BY MOUTH DAILY.    Acute uncomplicated injury outside film low morbidity    Fuller Canada, MD  10/09/2020 2:31 PM

## 2020-10-21 ENCOUNTER — Other Ambulatory Visit: Payer: 59 | Admitting: Adult Health

## 2020-11-06 ENCOUNTER — Encounter: Payer: Medicare Other | Admitting: Orthopedic Surgery

## 2020-11-06 ENCOUNTER — Encounter: Payer: Self-pay | Admitting: Orthopedic Surgery

## 2020-11-25 ENCOUNTER — Encounter: Payer: Self-pay | Admitting: Adult Health

## 2020-11-25 ENCOUNTER — Other Ambulatory Visit: Payer: Self-pay

## 2020-11-25 ENCOUNTER — Ambulatory Visit (INDEPENDENT_AMBULATORY_CARE_PROVIDER_SITE_OTHER): Payer: Medicare Other | Admitting: Adult Health

## 2020-11-25 VITALS — BP 110/69 | HR 62 | Ht 64.0 in | Wt 138.5 lb

## 2020-11-25 DIAGNOSIS — B369 Superficial mycosis, unspecified: Secondary | ICD-10-CM

## 2020-11-25 DIAGNOSIS — Z1211 Encounter for screening for malignant neoplasm of colon: Secondary | ICD-10-CM

## 2020-11-25 DIAGNOSIS — F439 Reaction to severe stress, unspecified: Secondary | ICD-10-CM

## 2020-11-25 DIAGNOSIS — Z90710 Acquired absence of both cervix and uterus: Secondary | ICD-10-CM | POA: Insufficient documentation

## 2020-11-25 DIAGNOSIS — Z01419 Encounter for gynecological examination (general) (routine) without abnormal findings: Secondary | ICD-10-CM

## 2020-11-25 LAB — HEMOCCULT GUIAC POC 1CARD (OFFICE): Fecal Occult Blood, POC: NEGATIVE

## 2020-11-25 MED ORDER — NYSTATIN 100000 UNIT/GM EX POWD: 1.0000 "application " | Freq: Two times a day (BID) | CUTANEOUS | 3 refills | Status: DC

## 2020-11-25 MED ORDER — VALACYCLOVIR HCL 1 G PO TABS: ORAL_TABLET | ORAL | 3 refills | Status: DC

## 2020-11-25 NOTE — Progress Notes (Signed)
Patient ID: Jacqueline Phillips, female   DOB: 04-Aug-1960, 60 y.o.   MRN: [id number] History of Present Illness: Jacqueline Phillips is a 60 year old white female, single, sp hysterectomy, in for a well woman gyn exam. PCP is Dr Katrinka Blazing.   Current Medications, Allergies, Past Medical History, Past Surgical History, Family History and Social History were reviewed in [employer] record.     Review of Systems: Patient denies any headaches, hearing loss, fatigue, blurred vision, shortness of breath, chest pain, abdominal pain, problems with bowel movements, urination, or intercourse. (Not active).No joint pain or mood swings.  +stress, has 83 yo grand daughter that is not happy living with her dad,she is trying to be supportive +cold in day time and hot flashes at night Sweats under beast and then itches.  Physical Exam:BP 110/69 (BP Location: Left Arm, Patient Position: Sitting, Cuff Size: Normal)   Pulse 62   Ht 5\' 4"  (1.626 m)   Wt 138 lb 8 oz (62.8 kg)   BMI 23.77 kg/m   General:  Well developed, well nourished, no acute distress Skin:  Warm and dry Neck:  Midline trachea, normal thyroid, good ROM, no lymphadenopathy,no carotid bruits heard Lungs; Clear to auscultation bilaterally Breast:  No dominant palpable mass, retraction, or nipple discharge Cardiovascular: Regular rate and rhythm Abdomen:  Soft, non tender, no hepatosplenomegaly Pelvic:  External genitalia is normal in appearance, no lesions.  The vagina is pale with loss of moisture and rugae. Urethra has no lesions or masses. The cervix and uterus are absent, no lesions at vaginal cuff.No adnexal masses or tenderness noted.Bladder is non tender, no masses felt. Rectal: Good sphincter tone, no polyps, or hemorrhoids felt.  Hemoccult negative. Extremities/musculoskeletal:  No swelling or varicosities noted, no clubbing or cyanosis Psych:  No mood changes, alert and cooperative,seems happy AA is 0  Fall risk is  high Depression screen [facility] 2/9 11/25/2020 06/06/2017 06/04/2016  Decreased Interest 0 0 0  Down, Depressed, Hopeless 0 0 0  PHQ - 2 Score 0 0 0  Altered sleeping 3 - -  Tired, decreased energy 1 - -  Change in appetite 2 - -  Feeling bad or failure about yourself  0 - -  Trouble concentrating 0 - -  Moving slowly or fidgety/restless 0 - -  Suicidal thoughts 0 - -  PHQ-9 Score 6 - -    GAD 7 : Generalized Anxiety Score 11/25/2020  Nervous, Anxious, on Edge 2  Control/stop worrying 2  Worry too much - different things 1  Trouble relaxing 1  Restless 0  Easily annoyed or irritable 0  Afraid - awful might happen 1  Total GAD 7 Score 7      Upstream - 11/25/20 1029       Pregnancy Intention Screening   Does the patient want to become pregnant in the next year? N/A    Does the patient's partner want to become pregnant in the next year? N/A    Would the patient like to discuss contraceptive options today? N/A      Contraception Wrap Up   Current Method Female Sterilization   hyst   End Method Female Sterilization   hyst   Contraception Counseling Provided No            Examination chaperoned by 11/27/20 LPN   Impression and Plan: 1. Encounter for well woman exam with routine gynecological exam Physical in 1 year Labs with PCP Mammogram yearly Colonoscopy 12/30/20 in [place]  2. Encounter for screening fecal occult blood testing   3. Superficial fungus infection of skin Will rx nystatin powders Meds ordered this encounter  Medications   nystatin (MYCOSTATIN/NYSTOP) powder    Sig: Apply 1 application topically 2 (two) times daily.    Dispense:  60 g    Refill:  3    Order Specific Question:   Supervising Provider    Answer:   Despina Hidden, LUTHER H [[id number]]   valACYclovir (VALTREX) 1000 MG tablet    Sig: TAKE 1 TABLET (1,000 MG TOTAL) BY MOUTH DAILY.    Dispense:  90 tablet    Refill:  3    Order Specific Question:   Supervising Provider    Answer:   Despina Hidden, LUTHER H  [[id number]]     4. Stress She requests refill on Valtrex   5. S/P hysterectomy

## 2021-03-06 ENCOUNTER — Other Ambulatory Visit (HOSPITAL_COMMUNITY): Payer: Self-pay | Admitting: Internal Medicine

## 2021-03-06 DIAGNOSIS — Z1231 Encounter for screening mammogram for malignant neoplasm of breast: Secondary | ICD-10-CM

## 2021-04-13 ENCOUNTER — Inpatient Hospital Stay (HOSPITAL_COMMUNITY): Admission: RE | Admit: 2021-04-13 | Payer: Medicare Other | Source: Ambulatory Visit

## 2021-05-25 ENCOUNTER — Ambulatory Visit (HOSPITAL_COMMUNITY)
Admission: RE | Admit: 2021-05-25 | Discharge: 2021-05-25 | Disposition: A | Discharge status: Home / Self Care | Payer: Medicare Other | Source: Ambulatory Visit | Attending: Internal Medicine | Admitting: Internal Medicine

## 2021-05-25 ENCOUNTER — Other Ambulatory Visit: Payer: Self-pay

## 2021-05-25 DIAGNOSIS — Z1231 Encounter for screening mammogram for malignant neoplasm of breast: Secondary | ICD-10-CM | POA: Diagnosis not present

## 2021-11-18 ENCOUNTER — Ambulatory Visit: Payer: Medicare Other | Admitting: Adult Health

## 2021-12-28 ENCOUNTER — Other Ambulatory Visit: Payer: Self-pay | Admitting: Adult Health

## 2021-12-31 ENCOUNTER — Ambulatory Visit: Payer: Medicare Other | Admitting: Adult Health

## 2022-02-09 ENCOUNTER — Ambulatory Visit: Payer: Medicare Other | Admitting: Adult Health

## 2022-04-30 ENCOUNTER — Other Ambulatory Visit (HOSPITAL_COMMUNITY): Payer: Self-pay | Admitting: Internal Medicine

## 2022-04-30 DIAGNOSIS — Z1231 Encounter for screening mammogram for malignant neoplasm of breast: Secondary | ICD-10-CM

## 2022-05-31 ENCOUNTER — Ambulatory Visit (HOSPITAL_COMMUNITY)
Admission: RE | Admit: 2022-05-31 | Discharge: 2022-05-31 | Disposition: A | Discharge status: Home / Self Care | Payer: 59 | Source: Ambulatory Visit | Attending: Internal Medicine | Admitting: Internal Medicine

## 2022-05-31 ENCOUNTER — Encounter (HOSPITAL_COMMUNITY): Payer: Self-pay

## 2022-05-31 DIAGNOSIS — Z1231 Encounter for screening mammogram for malignant neoplasm of breast: Secondary | ICD-10-CM | POA: Diagnosis not present

## 2022-08-23 ENCOUNTER — Other Ambulatory Visit: Payer: Self-pay | Admitting: Adult Health

## 2023-03-14 IMAGING — DX DG ANKLE COMPLETE 3+V*L*
3 series · 3 of 3 positions shown · non-contrast
Comparison: None.

CLINICAL DATA: Fall with twisting injury.  Ankle pain.

EXAM:
LEFT ANKLE COMPLETE - 3+ VIEW

[ankle ap]
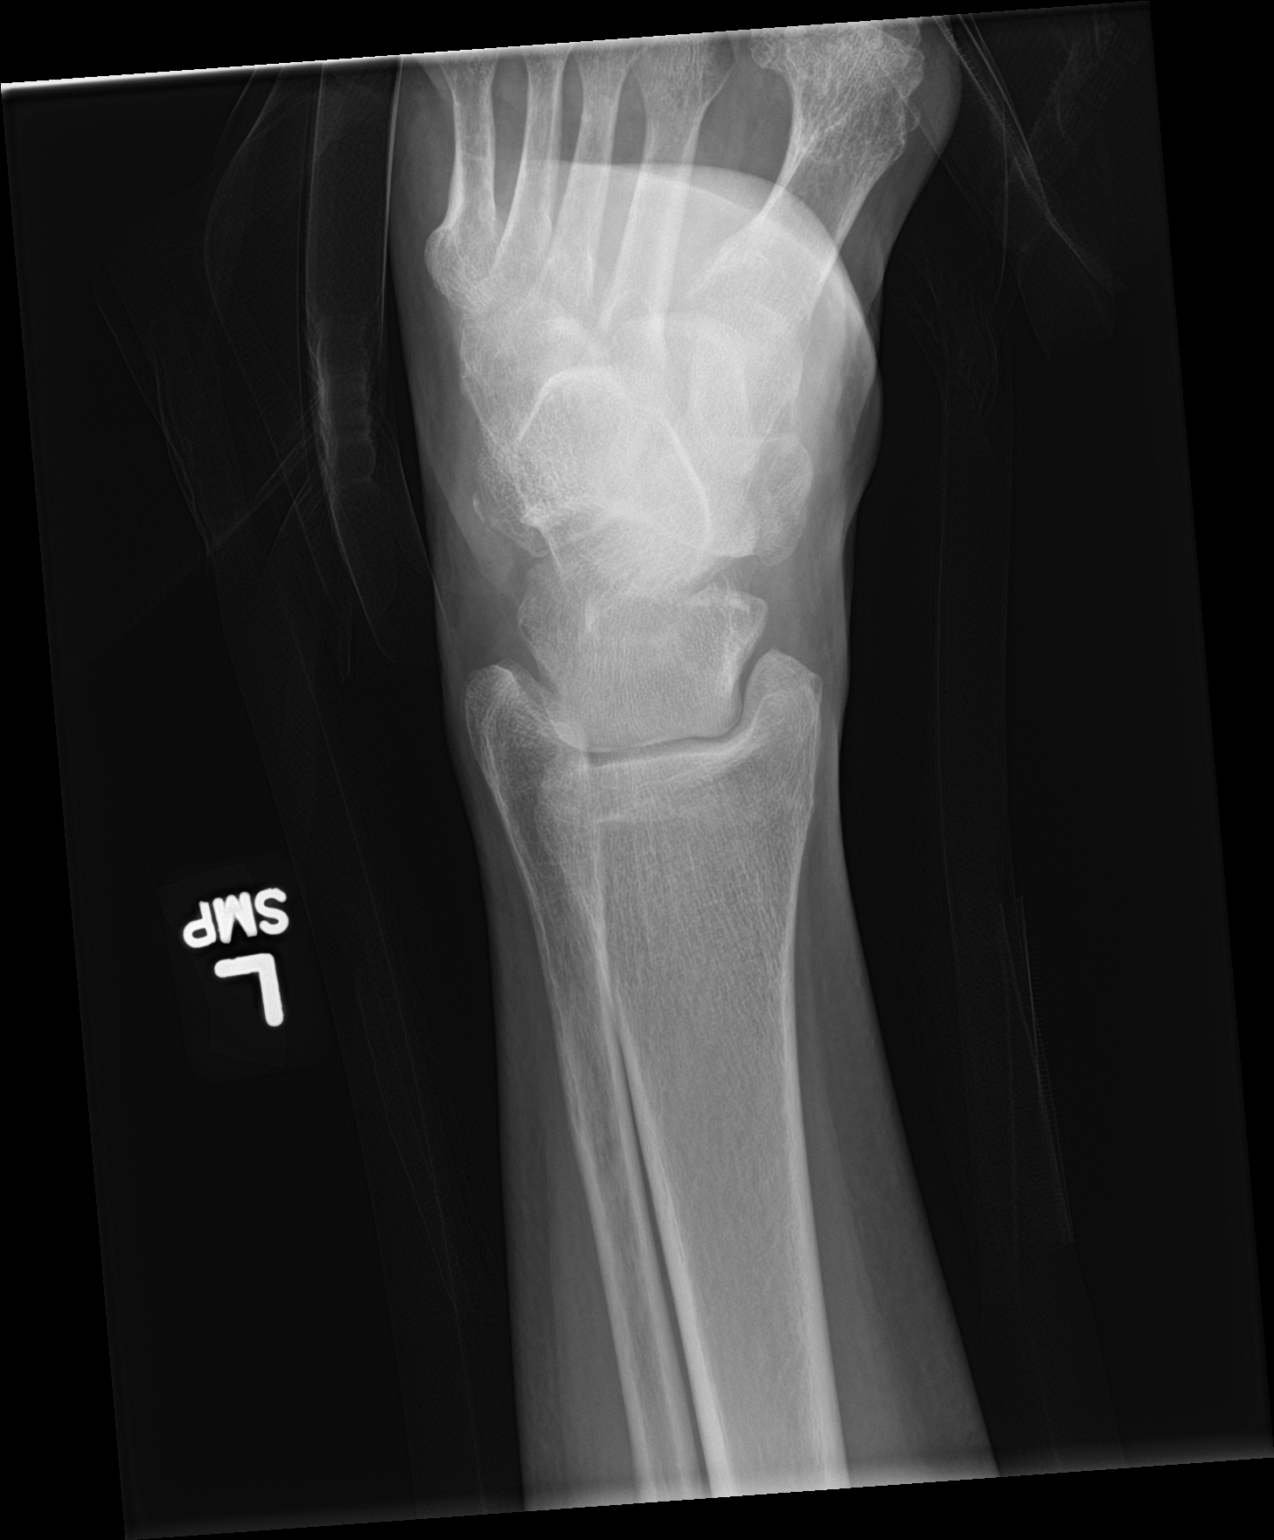

[ankle obl]
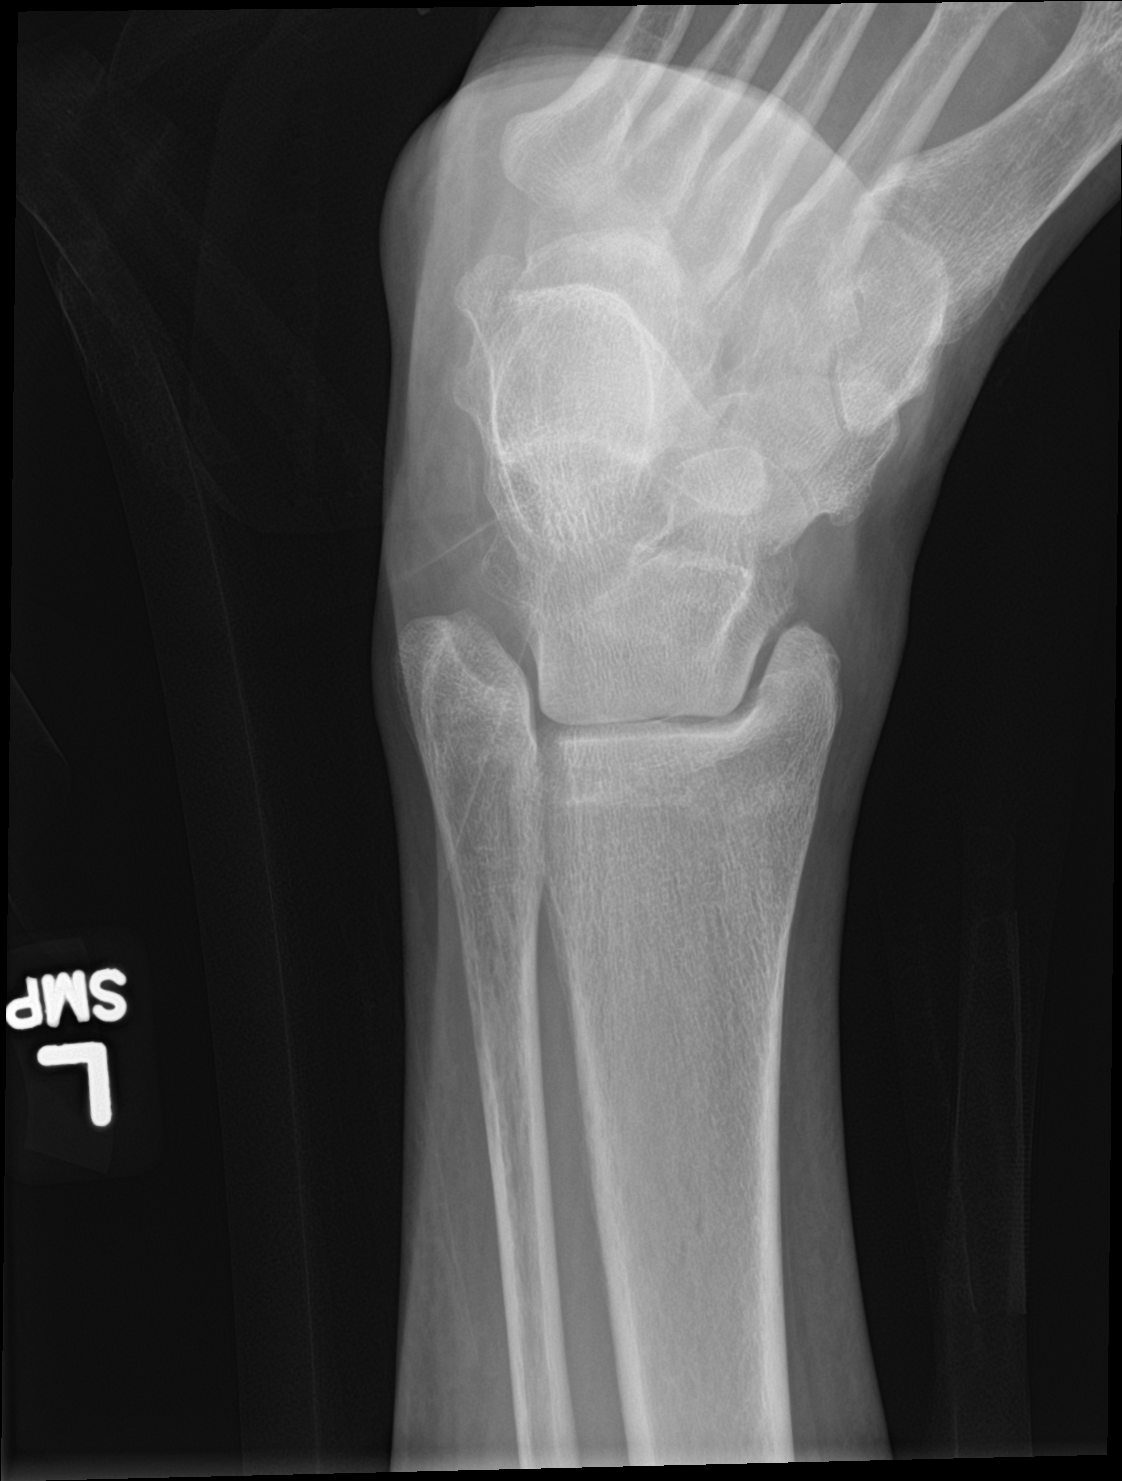

[ankle lat]
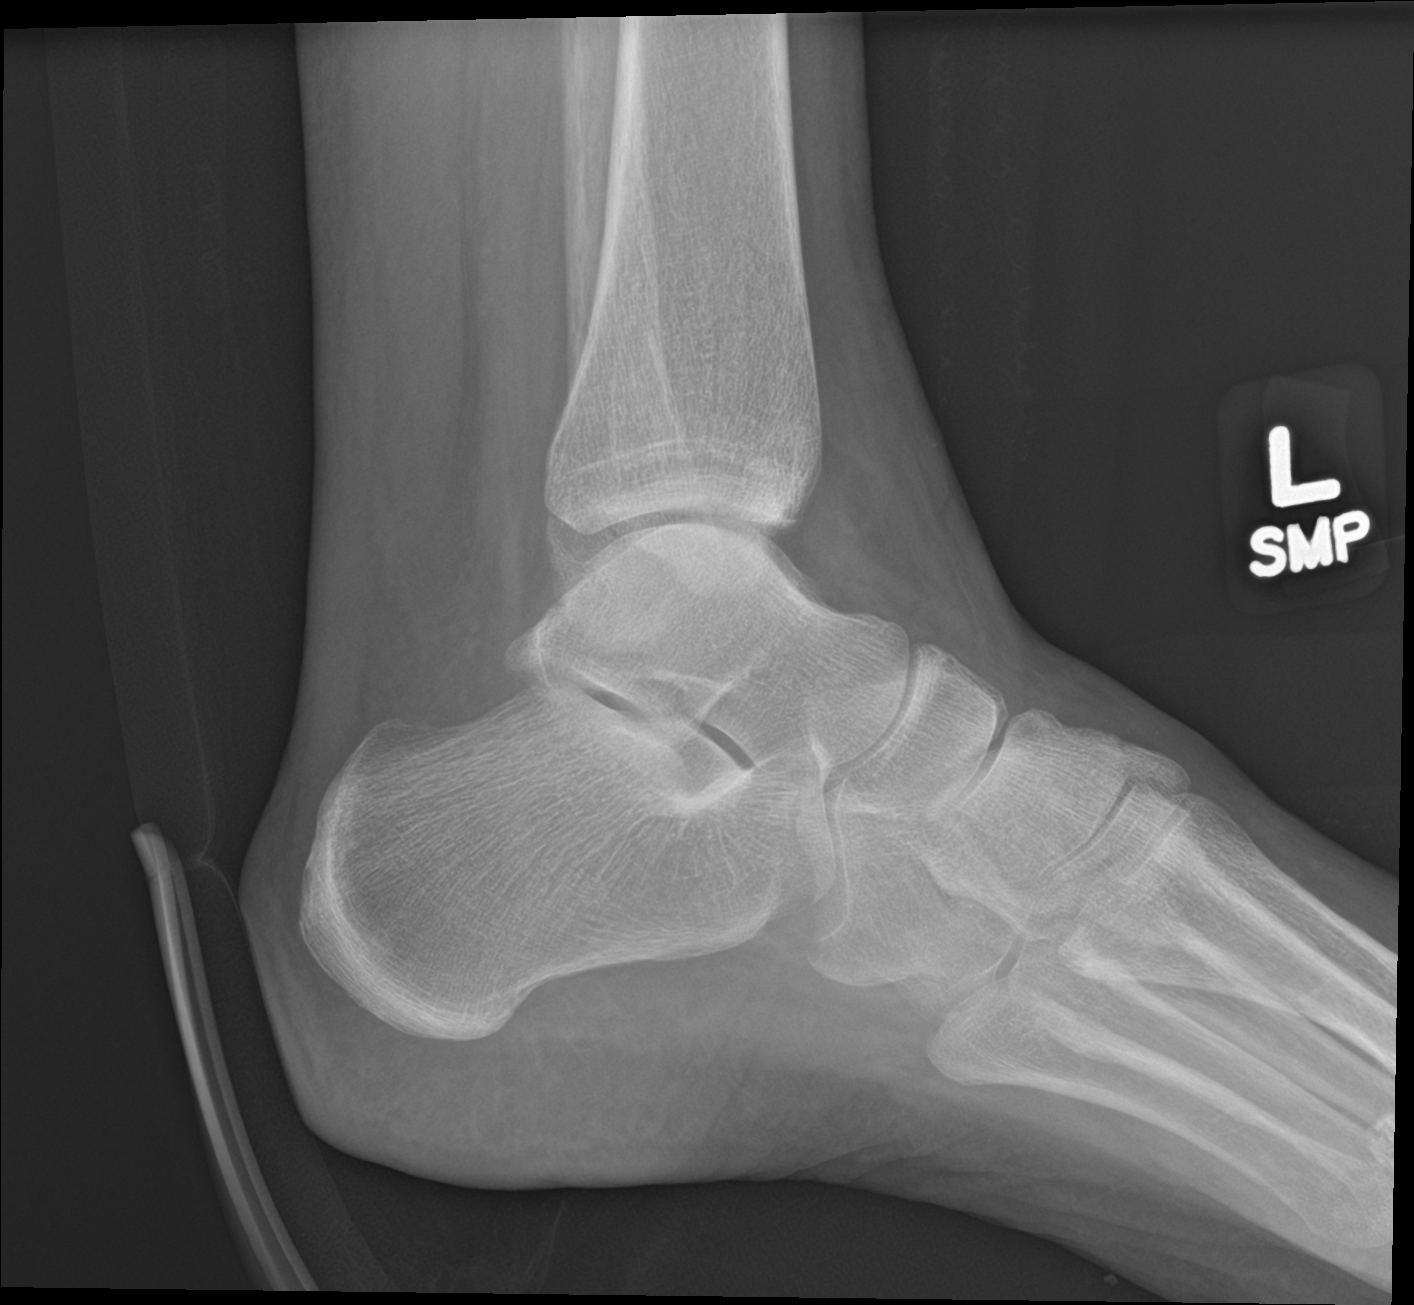

[3 of 3 positions shown; findings below may reference images not displayed]

FINDINGS: The bones appear mildly demineralized. There is no evidence of acute
fracture or dislocation at the ankle. There is a possible small
avulsion fracture from the lateral aspect of the calcaneus, best
seen on the AP view. The ankle joint spaces are preserved. No focal
soft tissue swelling or foreign body identified.
IMPRESSION: Possible small avulsion fracture laterally from the calcaneus. No
evidence of acute fracture or dislocation at the ankle.

## 2023-04-25 ENCOUNTER — Other Ambulatory Visit (HOSPITAL_COMMUNITY): Payer: Self-pay | Admitting: Internal Medicine

## 2023-04-25 DIAGNOSIS — Z1231 Encounter for screening mammogram for malignant neoplasm of breast: Secondary | ICD-10-CM

## 2023-05-16 ENCOUNTER — Encounter (HOSPITAL_COMMUNITY): Payer: Self-pay | Admitting: Internal Medicine

## 2023-06-02 ENCOUNTER — Ambulatory Visit (HOSPITAL_COMMUNITY): Payer: 59

## 2023-06-08 ENCOUNTER — Ambulatory Visit (HOSPITAL_COMMUNITY)

## 2023-07-28 ENCOUNTER — Ambulatory Visit (HOSPITAL_COMMUNITY)
Admission: RE | Admit: 2023-07-28 | Discharge: 2023-07-28 | Disposition: A | Discharge status: Home / Self Care | Source: Ambulatory Visit | Attending: Internal Medicine | Admitting: Internal Medicine

## 2023-07-28 ENCOUNTER — Encounter (HOSPITAL_COMMUNITY): Payer: Self-pay

## 2023-07-28 DIAGNOSIS — Z1231 Encounter for screening mammogram for malignant neoplasm of breast: Secondary | ICD-10-CM | POA: Insufficient documentation

## 2023-08-09 ENCOUNTER — Encounter: Payer: Self-pay | Admitting: Adult Health

## 2023-08-09 ENCOUNTER — Ambulatory Visit: Admitting: Adult Health

## 2023-08-09 VITALS — BP 109/64 | HR 75 | Ht 64.0 in | Wt 138.0 lb

## 2023-08-09 DIAGNOSIS — Z8619 Personal history of other infectious and parasitic diseases: Secondary | ICD-10-CM | POA: Diagnosis not present

## 2023-08-09 DIAGNOSIS — Z01419 Encounter for gynecological examination (general) (routine) without abnormal findings: Secondary | ICD-10-CM | POA: Diagnosis not present

## 2023-08-09 DIAGNOSIS — Z90710 Acquired absence of both cervix and uterus: Secondary | ICD-10-CM

## 2023-08-09 DIAGNOSIS — Z1331 Encounter for screening for depression: Secondary | ICD-10-CM | POA: Diagnosis not present

## 2023-08-09 MED ORDER — VALACYCLOVIR HCL 1 G PO TABS: ORAL_TABLET | ORAL | 3 refills | Status: DC

## 2023-08-09 NOTE — Progress Notes (Signed)
 Patient ID: Jacqueline Phillips, female   DOB: 06-09-1960, 63 y.o.   MRN: [id number] History of Present Illness: Jacqueline Phillips is a 63 year old white female,single sp hysterectomy in for a well woman gyn exam. She had dizzy spell and has seen PCP and cardiology, has had US  of carotids and wore monitor but still has other tests to do she says, has ?nodule on aorta.  She requests refill on valtrex .  PCP is Dr Felipe Horton    Current Medications, Allergies, Past Medical History, Past Surgical History, Family History and Social History were reviewed in [employer] [facility] electronic medical record.     Review of Systems: Patient denies any headaches, hearing loss, fatigue, blurred vision, shortness of breath, chest pain, abdominal pain, problems with bowel movements, urination, or intercourse(not active). No joint pain or mood swings.  See HPI for positives   Physical Exam:BP 109/64 (BP Location: Left Arm, Patient Position: Sitting, Cuff Size: Normal)   Pulse 75   Ht 5\' 4"  (1.626 m)   Wt 138 lb (62.6 kg)   BMI 23.69 kg/m   General:  Well developed, well nourished, no acute distress Skin:  Warm and dry Neck:  Midline trachea, normal thyroid, good ROM, no lymphadenopathy,no carotid bruits heard  Lungs; Clear to auscultation bilaterally Breast:  No dominant palpable mass, retraction, or nipple discharge Cardiovascular: Regular rate and rhythm Abdomen:  Soft, non tender, no hepatosplenomegaly Pelvic:  External genitalia is normal in appearance, no lesions.  The vagina is pale.  Urethra has no lesions or masses. The cervix and uterus are absent.  No adnexal masses or tenderness noted.Bladder is non tender, no masses felt. Rectal: Deferred at pt request Extremities/musculoskeletal:  No swelling or varicosities noted, no clubbing or cyanosis Psych:  No mood changes, alert and cooperative,seems happy AA is 0 Fall risk is low    08/09/2023    1:29 PM 11/25/2020   10:30 AM 06/06/2017   10:07 AM  Depression screen PHQ  2/9  Decreased Interest 0 0 0  Down, Depressed, Hopeless 0 0 0  PHQ - 2 Score 0 0 0  Altered sleeping 3 3   Tired, decreased energy 3 1   Change in appetite 3 2   Feeling bad or failure about yourself  0 0   Trouble concentrating 0 0   Moving slowly or fidgety/restless 0 0   Suicidal thoughts 0 0   PHQ-9 Score 9 6    Has xanax prn    08/09/2023    1:29 PM 11/25/2020   10:30 AM  GAD 7 : Generalized Anxiety Score  Nervous, Anxious, on Edge 1 2  Control/stop worrying 0 2  Worry too much - different things 1 1  Trouble relaxing 2 1  Restless 1 0  Easily annoyed or irritable 0 0  Afraid - awful might happen 0 1  Total GAD 7 Score 5 7    Upstream - 08/09/23 1336       Pregnancy Intention Screening   Does the patient want to become pregnant in the next year? N/A    Does the patient's partner want to become pregnant in the next year? N/A    Would the patient like to discuss contraceptive options today? N/A      Contraception Wrap Up   Current Method Female Sterilization   hyst   End Method Female Sterilization   hyst   Contraception Counseling Provided No              Examination  chaperoned by Alphonso Aschoff LPN   Impression and plan: 1. Encounter for well woman exam with routine gynecological exam (Primary) GYN Physical in 2 years Physical with PCP Labs with PCP Mammogram was negative 07/28/23 Colonoscopy 2026  2. History of herpes simplex infection Refilled valtrex  Meds ordered this encounter  Medications   valACYclovir  (VALTREX ) 1000 MG tablet    Sig: TAKE 1 TABLET (1,000 MG TOTAL) BY MOUTH DAILY.    Dispense:  90 tablet    Refill:  3    Supervising Provider:   Evalyn Hillier H [[id number]]     3. Sp hysterectomy

## 2023-08-17 ENCOUNTER — Other Ambulatory Visit (HOSPITAL_COMMUNITY): Payer: Self-pay | Admitting: Internal Medicine

## 2023-08-17 DIAGNOSIS — R079 Chest pain, unspecified: Secondary | ICD-10-CM

## 2023-08-19 ENCOUNTER — Other Ambulatory Visit: Payer: Self-pay | Admitting: Adult Health

## 2023-08-25 ENCOUNTER — Telehealth (HOSPITAL_COMMUNITY): Payer: Self-pay | Admitting: *Deleted

## 2023-08-25 NOTE — Telephone Encounter (Signed)
Reaching out to patient to offer assistance regarding upcoming cardiac imaging study; pt verbalizes understanding of appt date/time, parking situation and where to check in, pre-test NPO status  and verified current allergies; name and call back number provided for further questions should they arise ? ?Adalaya Irion RN Navigator Cardiac Imaging ?[facility] ?[phone] office ?[phone] cell ? ?

## 2023-08-26 ENCOUNTER — Ambulatory Visit (HOSPITAL_COMMUNITY)
Admission: RE | Admit: 2023-08-26 | Discharge: 2023-08-26 | Disposition: A | Discharge status: Home / Self Care | Source: Ambulatory Visit | Attending: Internal Medicine | Admitting: Internal Medicine

## 2023-08-26 DIAGNOSIS — R079 Chest pain, unspecified: Secondary | ICD-10-CM | POA: Diagnosis present

## 2023-08-26 DIAGNOSIS — I2510 Atherosclerotic heart disease of native coronary artery without angina pectoris: Secondary | ICD-10-CM | POA: Insufficient documentation

## 2023-08-26 DIAGNOSIS — I700 Atherosclerosis of aorta: Secondary | ICD-10-CM | POA: Diagnosis not present

## 2023-08-26 DIAGNOSIS — I2541 Coronary artery aneurysm: Secondary | ICD-10-CM | POA: Insufficient documentation

## 2023-08-26 MED ORDER — IOHEXOL 350 MG/ML SOLN
100.0000 mL | Freq: Once | INTRAVENOUS | Status: AC | PRN
  Administered 2023-08-26: 100 mL via INTRAVENOUS

## 2023-08-26 MED ORDER — NITROGLYCERIN 0.4 MG SL SUBL
0.8000 mg | SUBLINGUAL_TABLET | Freq: Once | SUBLINGUAL | Status: AC
  Administered 2023-08-26: 0.8 mg via SUBLINGUAL

## 2023-12-01 ENCOUNTER — Other Ambulatory Visit (HOSPITAL_COMMUNITY): Payer: Self-pay | Admitting: Internal Medicine

## 2023-12-01 DIAGNOSIS — R911 Solitary pulmonary nodule: Secondary | ICD-10-CM

## 2023-12-15 ENCOUNTER — Other Ambulatory Visit (HOSPITAL_COMMUNITY): Payer: Self-pay | Admitting: Internal Medicine

## 2023-12-15 DIAGNOSIS — R911 Solitary pulmonary nodule: Secondary | ICD-10-CM

## 2023-12-22 ENCOUNTER — Ambulatory Visit (HOSPITAL_COMMUNITY): Admission: RE | Admit: 2023-12-22 | Source: Ambulatory Visit

## 2023-12-29 ENCOUNTER — Encounter (HOSPITAL_COMMUNITY)
Admission: RE | Admit: 2023-12-29 | Discharge: 2023-12-29 | Disposition: A | Discharge status: Home / Self Care | Source: Ambulatory Visit | Attending: Internal Medicine | Admitting: Internal Medicine

## 2023-12-29 DIAGNOSIS — R911 Solitary pulmonary nodule: Secondary | ICD-10-CM | POA: Insufficient documentation

## 2023-12-29 MED ORDER — FLUDEOXYGLUCOSE F - 18 (FDG) INJECTION
7.4600 | Freq: Once | INTRAVENOUS | Status: AC | PRN
  Administered 2023-12-29: 7.46 via INTRAVENOUS
